# Patient Record
Sex: Female | Born: 1970 | Race: White | Hispanic: No | Marital: Married | Smoking: Never smoker
Health system: Southern US, Community
[De-identification: ages and names within clinical notes are randomized; demographics above are authoritative.]

## PROBLEM LIST (undated history)

## (undated) DIAGNOSIS — N6320 Unspecified lump in the left breast, unspecified quadrant: Secondary | ICD-10-CM

## (undated) DIAGNOSIS — Z923 Personal history of irradiation: Secondary | ICD-10-CM

## (undated) HISTORY — PX: BREAST LUMPECTOMY: SHX2

---

## 1988-12-01 HISTORY — PX: WISDOM TOOTH EXTRACTION: SHX21

## 2001-04-22 ENCOUNTER — Encounter: Admission: RE | Admit: 2001-04-22 | Discharge: 2001-04-22 | Payer: Self-pay | Admitting: Obstetrics and Gynecology

## 2001-04-22 ENCOUNTER — Encounter: Payer: Self-pay | Admitting: Obstetrics and Gynecology

## 2011-02-07 LAB — RPR: RPR: NONREACTIVE

## 2011-02-07 LAB — ABO/RH

## 2011-02-07 LAB — RUBELLA ANTIBODY, IGM: Rubella: NON-IMMUNE/NOT IMMUNE

## 2011-02-07 LAB — GC/CHLAMYDIA PROBE AMP, GENITAL: Chlamydia: NEGATIVE

## 2011-08-02 ENCOUNTER — Inpatient Hospital Stay (HOSPITAL_COMMUNITY): Admission: AD | Admit: 2011-08-02 | Payer: Self-pay | Source: Ambulatory Visit | Admitting: Obstetrics and Gynecology

## 2011-08-18 LAB — STREP B DNA PROBE: GBS: NEGATIVE

## 2011-09-08 ENCOUNTER — Other Ambulatory Visit: Payer: Self-pay | Admitting: Obstetrics and Gynecology

## 2011-09-08 ENCOUNTER — Encounter (HOSPITAL_COMMUNITY): Payer: Self-pay | Admitting: *Deleted

## 2011-09-08 ENCOUNTER — Inpatient Hospital Stay (HOSPITAL_COMMUNITY)
Admission: AD | Admit: 2011-09-08 | Discharge: 2011-09-11 | DRG: 765 | Disposition: A | Discharge status: Home / Self Care | Payer: Managed Care, Other (non HMO) | Source: Ambulatory Visit | Attending: Obstetrics and Gynecology | Admitting: Obstetrics and Gynecology

## 2011-09-08 ENCOUNTER — Encounter (HOSPITAL_COMMUNITY): Payer: Self-pay | Admitting: Anesthesiology

## 2011-09-08 ENCOUNTER — Encounter (HOSPITAL_COMMUNITY)
Admission: AD | Disposition: A | Discharge status: Home / Self Care | Payer: Self-pay | Source: Ambulatory Visit | Attending: Obstetrics and Gynecology

## 2011-09-08 ENCOUNTER — Inpatient Hospital Stay (HOSPITAL_COMMUNITY): Payer: Managed Care, Other (non HMO) | Admitting: Anesthesiology

## 2011-09-08 DIAGNOSIS — O324XX0 Maternal care for high head at term, not applicable or unspecified: Secondary | ICD-10-CM | POA: Diagnosis present

## 2011-09-08 DIAGNOSIS — O139 Gestational [pregnancy-induced] hypertension without significant proteinuria, unspecified trimester: Secondary | ICD-10-CM | POA: Diagnosis present

## 2011-09-08 DIAGNOSIS — O09519 Supervision of elderly primigravida, unspecified trimester: Secondary | ICD-10-CM | POA: Diagnosis present

## 2011-09-08 LAB — ABO/RH: RH Type: POSITIVE

## 2011-09-08 LAB — COMPREHENSIVE METABOLIC PANEL
ALT: 13 U/L (ref 0–35)
AST: 19 U/L (ref 0–37)
Albumin: 2.5 g/dL — ABNORMAL LOW (ref 3.5–5.2)
CO2: 20 mEq/L (ref 19–32)
Chloride: 102 mEq/L (ref 96–112)
GFR calc non Af Amer: 87 mL/min — ABNORMAL LOW (ref >90)
Potassium: 4.7 mEq/L (ref 3.5–5.1)
Sodium: 131 mEq/L — ABNORMAL LOW (ref 135–145)
Total Bilirubin: 0.3 mg/dL (ref 0.3–1.2)

## 2011-09-08 LAB — RPR: RPR: NONREACTIVE

## 2011-09-08 LAB — CBC
Hemoglobin: 14.2 g/dL (ref 12.0–15.0)
MCH: 32.8 pg (ref 26.0–34.0)
RBC: 4.33 MIL/uL (ref 3.87–5.11)
WBC: 8.9 10*3/uL (ref 4.0–10.5)

## 2011-09-08 LAB — RUBELLA ANTIBODY, IGM: Rubella: NON-IMMUNE/NOT IMMUNE

## 2011-09-08 LAB — STREP B DNA PROBE: GBS: NEGATIVE

## 2011-09-08 SURGERY — Surgical Case
Anesthesia: Epidural | Site: Abdomen | Wound class: Clean Contaminated

## 2011-09-08 MED ORDER — FENTANYL 2.5 MCG/ML BUPIVACAINE 1/10 % EPIDURAL INFUSION (WH - ANES)
INTRAMUSCULAR | Status: AC
  Filled 2011-09-08: qty 60

## 2011-09-08 MED ORDER — MEPERIDINE HCL 25 MG/ML IJ SOLN
INTRAMUSCULAR | Status: AC
  Filled 2011-09-08: qty 1

## 2011-09-08 MED ORDER — OXYTOCIN 20 UNITS IN LACTATED RINGERS INFUSION - SIMPLE
1.0000 m[IU]/min | INTRAVENOUS | Status: DC
  Administered 2011-09-08: 2 m[IU]/min via INTRAVENOUS
  Filled 2011-09-08: qty 1000

## 2011-09-08 MED ORDER — FENTANYL 2.5 MCG/ML BUPIVACAINE 1/10 % EPIDURAL INFUSION (WH - ANES)
INTRAMUSCULAR | Status: DC | PRN
  Administered 2011-09-08: 14 mL/h
  Administered 2011-09-08: 14 mL/h via EPIDURAL

## 2011-09-08 MED ORDER — EPHEDRINE 5 MG/ML INJ
INTRAVENOUS | Status: AC
  Filled 2011-09-08: qty 4

## 2011-09-08 MED ORDER — KETOROLAC TROMETHAMINE 60 MG/2ML IM SOLN
INTRAMUSCULAR | Status: AC
  Filled 2011-09-08: qty 2

## 2011-09-08 MED ORDER — CITRIC ACID-SODIUM CITRATE 334-500 MG/5ML PO SOLN
30.0000 mL | ORAL | Status: DC | PRN
  Administered 2011-09-08: 30 mL via ORAL
  Filled 2011-09-08: qty 15

## 2011-09-08 MED ORDER — SCOPOLAMINE 1 MG/3DAYS TD PT72
MEDICATED_PATCH | TRANSDERMAL | Status: AC
  Filled 2011-09-08: qty 1

## 2011-09-08 MED ORDER — ONDANSETRON HCL 4 MG/2ML IJ SOLN
INTRAMUSCULAR | Status: AC
  Filled 2011-09-08: qty 2

## 2011-09-08 MED ORDER — HYDROMORPHONE HCL 1 MG/ML IJ SOLN: 0.2500 mg | INTRAMUSCULAR | Status: DC | PRN

## 2011-09-08 MED ORDER — MORPHINE SULFATE 0.5 MG/ML IJ SOLN
INTRAMUSCULAR | Status: AC
  Filled 2011-09-08: qty 10

## 2011-09-08 MED ORDER — SODIUM BICARBONATE 8.4 % IV SOLN
INTRAVENOUS | Status: DC | PRN
  Administered 2011-09-08: 5 mL via EPIDURAL

## 2011-09-08 MED ORDER — OXYTOCIN 20 UNITS IN LACTATED RINGERS INFUSION - SIMPLE: 125.0000 mL/h | Freq: Once | INTRAVENOUS | Status: DC

## 2011-09-08 MED ORDER — FENTANYL CITRATE 0.05 MG/ML IJ SOLN
INTRAMUSCULAR | Status: AC
  Filled 2011-09-08: qty 2

## 2011-09-08 MED ORDER — DIPHENHYDRAMINE HCL 50 MG/ML IJ SOLN: 12.5000 mg | INTRAMUSCULAR | Status: DC | PRN

## 2011-09-08 MED ORDER — OXYTOCIN BOLUS FROM INFUSION
500.0000 mL | Freq: Once | INTRAVENOUS | Status: DC
  Filled 2011-09-08: qty 500

## 2011-09-08 MED ORDER — MEPERIDINE HCL 25 MG/ML IJ SOLN
INTRAMUSCULAR | Status: DC | PRN
  Administered 2011-09-08: 7 mg via INTRAVENOUS
  Administered 2011-09-08 (×3): 6 mg via INTRAVENOUS

## 2011-09-08 MED ORDER — OXYTOCIN 20 UNITS IN LACTATED RINGERS INFUSION - SIMPLE
INTRAVENOUS | Status: DC | PRN
  Administered 2011-09-08 (×2): 20 [IU] via INTRAVENOUS

## 2011-09-08 MED ORDER — MEPERIDINE HCL 25 MG/ML IJ SOLN
6.2500 mg | INTRAMUSCULAR | Status: DC | PRN
  Administered 2011-09-08: 12.5 mg via INTRAVENOUS

## 2011-09-08 MED ORDER — KETOROLAC TROMETHAMINE 30 MG/ML IJ SOLN: 15.0000 mg | Freq: Once | INTRAMUSCULAR | Status: AC | PRN

## 2011-09-08 MED ORDER — EPHEDRINE 5 MG/ML INJ
10.0000 mg | INTRAVENOUS | Status: DC | PRN
  Filled 2011-09-08: qty 4

## 2011-09-08 MED ORDER — PHENYLEPHRINE 40 MCG/ML (10ML) SYRINGE FOR IV PUSH (FOR BLOOD PRESSURE SUPPORT)
80.0000 ug | PREFILLED_SYRINGE | INTRAVENOUS | Status: DC | PRN
  Filled 2011-09-08: qty 5

## 2011-09-08 MED ORDER — SCOPOLAMINE 1 MG/3DAYS TD PT72
1.0000 | MEDICATED_PATCH | Freq: Once | TRANSDERMAL | Status: DC
  Administered 2011-09-09: 1.5 mg via TRANSDERMAL

## 2011-09-08 MED ORDER — OXYTOCIN 10 UNIT/ML IJ SOLN
INTRAMUSCULAR | Status: AC
  Filled 2011-09-08: qty 4

## 2011-09-08 MED ORDER — CEFAZOLIN SODIUM 1-5 GM-% IV SOLN
1.0000 g | Freq: Once | INTRAVENOUS | Status: AC
  Administered 2011-09-08: 1 g via INTRAVENOUS
  Filled 2011-09-08: qty 50

## 2011-09-08 MED ORDER — ONDANSETRON HCL 4 MG/2ML IJ SOLN
INTRAMUSCULAR | Status: DC | PRN
  Administered 2011-09-08: 4 mg via INTRAVENOUS

## 2011-09-08 MED ORDER — ONDANSETRON HCL 4 MG/2ML IJ SOLN: 4.0000 mg | Freq: Once | INTRAMUSCULAR | Status: AC | PRN

## 2011-09-08 MED ORDER — MORPHINE SULFATE (PF) 0.5 MG/ML IJ SOLN
INTRAMUSCULAR | Status: DC | PRN
Start: 2011-09-08 — End: 2011-09-08
  Administered 2011-09-08: 1 mg via INTRAVENOUS

## 2011-09-08 MED ORDER — LACTATED RINGERS IV SOLN: 500.0000 mL | Freq: Once | INTRAVENOUS | Status: DC

## 2011-09-08 MED ORDER — KETOROLAC TROMETHAMINE 60 MG/2ML IM SOLN
60.0000 mg | Freq: Once | INTRAMUSCULAR | Status: AC | PRN
  Administered 2011-09-09: 60 mg via INTRAMUSCULAR

## 2011-09-08 MED ORDER — LIDOCAINE HCL (PF) 1 % IJ SOLN
30.0000 mL | INTRAMUSCULAR | Status: DC | PRN
  Filled 2011-09-08 (×2): qty 30

## 2011-09-08 MED ORDER — PHENYLEPHRINE HCL 10 MG/ML IJ SOLN
INTRAMUSCULAR | Status: DC | PRN
  Administered 2011-09-08: 80 ug via INTRAVENOUS

## 2011-09-08 MED ORDER — ACETAMINOPHEN 325 MG PO TABS
650.0000 mg | ORAL_TABLET | ORAL | Status: DC | PRN
  Administered 2011-09-08: 650 mg via ORAL
  Filled 2011-09-08: qty 2

## 2011-09-08 MED ORDER — OXYCODONE-ACETAMINOPHEN 5-325 MG PO TABS: 2.0000 | ORAL_TABLET | ORAL | Status: DC | PRN

## 2011-09-08 MED ORDER — PHENYLEPHRINE 40 MCG/ML (10ML) SYRINGE FOR IV PUSH (FOR BLOOD PRESSURE SUPPORT)
PREFILLED_SYRINGE | INTRAVENOUS | Status: AC
  Filled 2011-09-08: qty 5

## 2011-09-08 MED ORDER — OXYTOCIN 20 UNITS IN LACTATED RINGERS INFUSION - SIMPLE
125.0000 mL/h | INTRAVENOUS | Status: AC
  Administered 2011-09-08: 125 mL/h via INTRAVENOUS

## 2011-09-08 MED ORDER — TERBUTALINE SULFATE 1 MG/ML IJ SOLN: 0.2500 mg | Freq: Once | INTRAMUSCULAR | Status: AC | PRN

## 2011-09-08 MED ORDER — ONDANSETRON HCL 4 MG/2ML IJ SOLN
4.0000 mg | Freq: Four times a day (QID) | INTRAMUSCULAR | Status: DC | PRN
  Administered 2011-09-08: 4 mg via INTRAVENOUS
  Filled 2011-09-08: qty 2

## 2011-09-08 MED ORDER — OXYTOCIN 20 UNITS IN LACTATED RINGERS INFUSION - SIMPLE
INTRAVENOUS | Status: AC
  Filled 2011-09-08: qty 1000

## 2011-09-08 MED ORDER — FENTANYL 2.5 MCG/ML BUPIVACAINE 1/10 % EPIDURAL INFUSION (WH - ANES)
14.0000 mL/h | INTRAMUSCULAR | Status: DC
  Filled 2011-09-08: qty 60

## 2011-09-08 MED ORDER — LACTATED RINGERS IV SOLN
INTRAVENOUS | Status: DC
  Administered 2011-09-08 (×4): via INTRAVENOUS

## 2011-09-08 MED ORDER — SODIUM BICARBONATE 8.4 % IV SOLN
INTRAVENOUS | Status: DC | PRN
  Administered 2011-09-08: 4 mL via EPIDURAL

## 2011-09-08 MED ORDER — MORPHINE SULFATE (PF) 0.5 MG/ML IJ SOLN
INTRAMUSCULAR | Status: DC | PRN
  Administered 2011-09-08: 4 mg via EPIDURAL

## 2011-09-08 MED ORDER — LIDOCAINE-EPINEPHRINE (PF) 2 %-1:200000 IJ SOLN
INTRAMUSCULAR | Status: AC
  Filled 2011-09-08: qty 20

## 2011-09-08 MED ORDER — FLEET ENEMA 7-19 GM/118ML RE ENEM: 1.0000 | ENEMA | RECTAL | Status: DC | PRN

## 2011-09-08 MED ORDER — LACTATED RINGERS IV SOLN
500.0000 mL | INTRAVENOUS | Status: DC | PRN
  Administered 2011-09-08: 300 mL via INTRAVENOUS

## 2011-09-08 MED ORDER — IBUPROFEN 600 MG PO TABS: 600.0000 mg | ORAL_TABLET | Freq: Four times a day (QID) | ORAL | Status: DC | PRN

## 2011-09-08 MED ORDER — FENTANYL CITRATE 0.05 MG/ML IJ SOLN
INTRAMUSCULAR | Status: DC | PRN
  Administered 2011-09-08 (×2): 50 ug via INTRAVENOUS

## 2011-09-08 MED ORDER — SODIUM BICARBONATE 8.4 % IV SOLN
INTRAVENOUS | Status: AC
  Filled 2011-09-08: qty 50

## 2011-09-08 SURGICAL SUPPLY — 31 items
CLOTH BEACON ORANGE TIMEOUT ST (SAFETY) ×2 IMPLANT
DRESSING TELFA 8X3 (GAUZE/BANDAGES/DRESSINGS) ×1 IMPLANT
ELECT REM PT RETURN 9FT ADLT (ELECTROSURGICAL) ×2
ELECTRODE REM PT RTRN 9FT ADLT (ELECTROSURGICAL) ×1 IMPLANT
EXTRACTOR VACUUM M CUP 4 TUBE (SUCTIONS) IMPLANT
GAUZE SPONGE 4X4 12PLY STRL LF (GAUZE/BANDAGES/DRESSINGS) ×4 IMPLANT
GLOVE BIO SURGEON STRL SZ7 (GLOVE) ×4 IMPLANT
GOWN PREVENTION PLUS LG XLONG (DISPOSABLE) ×4 IMPLANT
GOWN STRL REIN XL XLG (GOWN DISPOSABLE) ×2 IMPLANT
KIT ABG SYR 3ML LUER SLIP (SYRINGE) ×2 IMPLANT
NDL HYPO 25X5/8 SAFETYGLIDE (NEEDLE) ×1 IMPLANT
NEEDLE HYPO 25X5/8 SAFETYGLIDE (NEEDLE) ×2 IMPLANT
NS IRRIG 1000ML POUR BTL (IV SOLUTION) ×2 IMPLANT
PACK C SECTION WH (CUSTOM PROCEDURE TRAY) ×2 IMPLANT
PAD ABD 7.5X8 STRL (GAUZE/BANDAGES/DRESSINGS) ×1 IMPLANT
SLEEVE SCD COMPRESS KNEE MED (MISCELLANEOUS) IMPLANT
SPONGE GAUZE 4X4 12PLY (GAUZE/BANDAGES/DRESSINGS) ×1 IMPLANT
STAPLER VISISTAT 35W (STAPLE) ×1 IMPLANT
STRIP CLOSURE SKIN 1/4X4 (GAUZE/BANDAGES/DRESSINGS) ×1 IMPLANT
SUT CHROMIC 0 CT 1 (SUTURE) IMPLANT
SUT CHROMIC 0 CTX 36 (SUTURE) ×4 IMPLANT
SUT CHROMIC 2 0 SH (SUTURE) IMPLANT
SUT PDS AB 0 CT 36 (SUTURE) ×2 IMPLANT
SUT PLAIN 0 NONE (SUTURE) IMPLANT
SUT PLAIN 2 0 XLH (SUTURE) IMPLANT
SUT VIC AB 3-0 CT1 27 (SUTURE) ×2
SUT VIC AB 3-0 CT1 TAPERPNT 27 (SUTURE) ×1 IMPLANT
TAPE CLOTH SURG 4X10 WHT LF (GAUZE/BANDAGES/DRESSINGS) ×1 IMPLANT
TOWEL OR 17X24 6PK STRL BLUE (TOWEL DISPOSABLE) ×4 IMPLANT
TRAY FOLEY CATH 14FR (SET/KITS/TRAYS/PACK) ×1 IMPLANT
WATER STERILE IRR 1000ML POUR (IV SOLUTION) ×1 IMPLANT

## 2011-09-08 NOTE — Anesthesia Postprocedure Evaluation (Signed)
  Anesthesia Post-op Note  Patient: Traci Kemp  Procedure(s) Performed:  CESAREAN SECTION  Patient Location: PACU  Anesthesia Type: Epidural  Level of Consciousness: awake and patient cooperative  Airway and Oxygen Therapy: Patient Spontanous Breathing  Post-op Pain: none  Post-op Assessment: Post-op Vital signs reviewed  Post-op Vital Signs: Reviewed and stable  Complications: No apparent anesthesia complications

## 2011-09-08 NOTE — H&P (Signed)
Traci Kemp is a 40 y.o. female at 24 weeks admitted for induction due to [facility].  Followed with increasing bp"s over the last week.  BP today 144/100. cx adminable to arom.  Neg GBS Maternal Medical History:  Prenatal Complications - Diabetes: none.    OB History    Grav Para Term Preterm Abortions TAB SAB Ect Mult Living   1              History reviewed. No pertinent past medical history. Past Surgical History  Procedure Date  . Wisdom tooth extraction 1990   Family History: family history is not on file. Social History:  reports that she has never smoked. She has never used smokeless tobacco. She reports that she does not drink alcohol or use illicit drugs.  ROS  Dilation: 2 Effacement (%): 80 Station: -1 Exam by:: Dr Arelia Sneddon Blood pressure 155/95, pulse 74, temperature 98.2 F (36.8 C), temperature source Oral, resp. rate 20, height 5\' 1"  (1.549 m), weight 170 lb (77.111 kg), last menstrual period 12/09/2010. Maternal Exam:  Uterine Assessment: Contraction strength is mild.  Contraction frequency is irregular.   Abdomen: Patient reports no abdominal tenderness. Fundal height is c/w dates.   Estimated fetal weight is 7 lbs.   Fetal presentation: vertex  Introitus: Amniotic fluid character: clear.  Pelvis: adequate for delivery.   Cervix: Cervix evaluated by digital exam.     Physical Exam  Prenatal labs: ABO, Rh:   Antibody:   Rubella:   RPR:    HBsAg:    HIV:    GBS:     Assessment/Plan: IUP at 39 weeks.  Admitt for induction. Risk of pitocin discussed.   Traci Kemp S 09/08/2011, 1:07 PM

## 2011-09-08 NOTE — Transfer of Care (Signed)
Immediate Anesthesia Transfer of Care Note  Patient: Traci Kemp  Procedure(s) Performed:  CESAREAN SECTION  Patient Location: PACU  Anesthesia Type: Epidural  Level of Consciousness: awake  Airway & Oxygen Therapy: Patient Spontanous Breathing  Post-op Assessment: Report given to PACU RN  Post vital signs: Reviewed and stable  Complications: No apparent anesthesia complications

## 2011-09-08 NOTE — Op Note (Signed)
Preoperative diagnosis: Intrauterine pregnancy at term. Pregnancy-induced hypertension. Held vacuum extractor. Failure to descend.  Postoperative diagnosis: The same  Procedure: Low transverse cesarean section  Surgeon: Richardean Chimera  Anesthesia: Epidural  Estimated blood loss: 500 cc  Intraoperative blood replacement: None  Complications: None  Indications: This 40 year old primigravida female at 46 weeks. She's been followed in the office per the pregnancy-induced hypertension over the past 2 weeks today's blood pressure is 144/100. Cervix was amenable to adduction. She was brought in for induction of labor. He progressed to complete dilatation. He pushed for 2 and half hours. It'll vertex remained arrested at +2 station. The decision was to attempt a vacuum assisted vaginal delivery. Risk were discussed. The vacuum extractor there was no descent. The decision was to proceed with the primary cesarean section. The risk were discussed this includes the risk of infection. The risk of hemorrhage that could require transfusion with the risk of AIDS or hepatitis. Risk of injury to adjacent organs bleeding bladder bowel or ureters. Risk of deep venous thrombosis and pulmonary embolus.  Procedure: Patient segment oh are placed in supine position with left lateral tilt. After satisfactory level of epidural anesthesia was obtained the patient was prepped out Betadine and draped as a sterile field. A low transverse skin incision made with the knife and carried to subcutaneous tissue. The anterior rectus fascia was identified in her sharply. The incision the fascia was extended laterally. The rectus muscles were separated in the midline. The anterior peritoneum was entered sharply and the incision the peritoneum was extended both superiorly and inferiorly. A low transverse bladder flap was developed. A low transverse uterine incision was begun with the knife and extended laterally using manual traction. The  infant presented in the vertex presentation and was delivered with elevation head and fundal pressure. The infant was a viable female weight is pending. Apgars were 8/9. PH was 7.25. The placenta was delivered manually and sent to pathology. Uterus was exteriorized for closure. She had a subserosal fibroid posteriorly measuring approximately 3 cm. Tubes and ovaries were unremarkable. Uterus is) interlocking suture of 0 chromic using a 2 layer closure technique. We had good hemostasis and clear urine output. The uterus was returned to the abdominal cavity. We irrigated the pelvis. We had no active bleeding. Muscles and peritoneum) suture of 3-0 Vicryl. Fascia was closed with a running suture of 0 PDS. Skin was closed with staples and Steri-Strips. Sponge instrument and needle count prescribed by circulating nurse x2. Foley catheter was clear, closure. Patient was transferred recovery in good condition.

## 2011-09-08 NOTE — Anesthesia Preprocedure Evaluation (Addendum)
Anesthesia Evaluation  Name, MR# and DOB Patient awake  General Assessment Comment  Reviewed: Allergy & Precautions, H&P , Patient's Chart, lab work & pertinent test results  Airway Mallampati: II TM Distance: >3 FB Neck ROM: full    Dental  (+) Teeth Intact   Pulmonary  clear to auscultation        Cardiovascular hypertension (PIH, no meds), regular Normal    Neuro/Psych    GI/Hepatic   Endo/Other    Renal/GU      Musculoskeletal   Abdominal   Peds  Hematology   Anesthesia Other Findings       Reproductive/Obstetrics (+) Pregnancy                          Anesthesia Physical Anesthesia Plan  ASA: III  Anesthesia Plan: Epidural   Post-op Pain Management:    Induction:   Airway Management Planned:   Additional Equipment:   Intra-op Plan:   Post-operative Plan:   Informed Consent: I have reviewed the patients History and Physical, chart, labs and discussed the procedure including the risks, benefits and alternatives for the proposed anesthesia with the patient or authorized representative who has indicated his/her understanding and acceptance.   Dental Advisory Given  Plan Discussed with: CRNA and Surgeon  Anesthesia Plan Comments: (Labs checked- platelets confirmed with RN in room. Fetal heart tracing, per RN, reportedly stable enough for sitting procedure. Discussed epidural, and patient consents to the procedure:  included risk of possible headache,backache, failed block, allergic reaction, and nerve injury. This patient was asked if she had any questions or concerns before the procedure started. )       Anesthesia Quick Evaluation

## 2011-09-08 NOTE — Plan of Care (Signed)
Problem: Consults Goal: Birthing Suites Patient Information Press F2 to bring up selections list  Outcome: Completed/Met Date Met:  09/08/11  PIH (Pregnancy induced hypertension)

## 2011-09-08 NOTE — Brief Op Note (Signed)
09/08/2011  10:07 PM  PATIENT:  Traci Kemp  40 y.o. female  PRE-OPERATIVE DIAGNOSIS:  Arrest of Descent  POST-OPERATIVE DIAGNOSIS:  Arrest of Descent  PROCEDURE:  Procedure(s): CESAREAN SECTION  SURGEON:  Surgeon(s): Torey Reinard S Lakeyn Dokken  PHYSICIAN ASSISTANT:   ASSISTANTS: none   ANESTHESIA:   epidural  OR FLUID I/O:  Total I/O In: -  Out: 5 [Urine:5]  BLOOD ADMINISTERED:none  DRAINS: none   LOCAL MEDICATIONS USED:  NONE  SPECIMEN:  Source of Specimen:  placnta  DISPOSITION OF SPECIMEN:  PATHOLOGY  COUNTS:  YES  TOURNIQUET:  * No tourniquets in log *  DICTATION: .Dragon Dictation  PLAN OF CARE: Admit to inpatient   PATIENT DISPOSITION:  PACU - hemodynamically stable.   Delay start of Pharmacological VTE agent (>24hrs) due to surgical blood loss or risk of bleeding:  no

## 2011-09-08 NOTE — Progress Notes (Signed)
Pushing for 2.5 hours vtx at +2.  Discussed ve.. Risk explained including scalp hematoma and intracranial bleed. Pt preppedThe risk of cesarean section were discussed including.  The risk of infection.  The risk of  Hemorrhage that could lead to transfusion with risk of AIDs and Hepatitis.  Excessive bleeding could require hysterectomy.  Risk of injury to adjacent organs including bladder, ureters and bowel which could lead to further exploratory surgery. The risk of DVT and pulmonary embolus.  Patient expresses understanding of indications and risks.  and draped.  In dorsal lithotomy position ve applied with next ctx minimal descent with VE.  Precede with Cesarean section.

## 2011-09-08 NOTE — Anesthesia Procedure Notes (Signed)
Epidural Patient location during procedure: OB  Staffing Anesthesiologist: Jiles Garter  Preanesthetic Checklist Completed: patient identified, site marked, surgical consent, pre-op evaluation, timeout performed, IV checked, risks and benefits discussed and monitors and equipment checked  Epidural Patient position: sitting Prep: site prepped and draped and DuraPrep Patient monitoring: continuous pulse ox and blood pressure Approach: midline Injection technique: LOR air  Needle:  Needle type: Tuohy  Needle gauge: 17 G Needle length: 9 cm Needle insertion depth: 6 cm Catheter type: closed end flexible Catheter size: 19 Gauge Catheter at skin depth: 12 cm Test dose: negative  Assessment Events: blood not aspirated, injection not painful, no injection resistance, negative IV test and no paresthesia  Additional Notes Dosing of Epidural: 1st dose, through needle...... ( mg expressed as equavilent  cc's medication  from .1%Bupiv / fentanyl syringe from L&D pump)...............  5mg  Marcaine  2nd dose, through catheter after waiting 3 minutes.... epi 1:200K + Xylocaine 40 mg 3rd dose, through catheter, after waiting 3 minutes...Marland KitchenMarland Kitchenepi 1:200K + Xylocaine 40 mg ( 2% Xylo charted as a single dose in Epic Meds for ease of charting; actual dosing was fractionated as above, for saftey's sake)  As each dose occurred, patient was free of IV sx; and patient exhibited no evidence of SA injection.  Patient is more comfortable after epidural dosed. Please see RN's note for documentation of vital signs,and FHR which are stable.

## 2011-09-09 ENCOUNTER — Encounter (HOSPITAL_COMMUNITY): Payer: Self-pay | Admitting: Obstetrics and Gynecology

## 2011-09-09 LAB — CBC
Hemoglobin: 10.9 g/dL — ABNORMAL LOW (ref 12.0–15.0)
MCH: 32.5 pg (ref 26.0–34.0)
MCHC: 34.2 g/dL (ref 30.0–36.0)
MCV: 94.4 fL (ref 78.0–100.0)
Platelets: 136 10*3/uL — ABNORMAL LOW (ref 150–400)
RDW: 13.9 % (ref 11.5–15.5)
WBC: 15.2 10*3/uL — ABNORMAL HIGH (ref 4.0–10.5)

## 2011-09-09 LAB — COMPREHENSIVE METABOLIC PANEL
AST: 38 U/L — ABNORMAL HIGH (ref 0–37)
Albumin: 2.1 g/dL — ABNORMAL LOW (ref 3.5–5.2)
Calcium: 8.3 mg/dL — ABNORMAL LOW (ref 8.4–10.5)
Chloride: 100 mEq/L (ref 96–112)
Creatinine, Ser: 1.1 mg/dL (ref 0.50–1.10)
Total Bilirubin: 0.3 mg/dL (ref 0.3–1.2)
Total Protein: 4.9 g/dL — ABNORMAL LOW (ref 6.0–8.3)

## 2011-09-09 LAB — RPR: RPR Ser Ql: NONREACTIVE

## 2011-09-09 MED ORDER — SODIUM CHLORIDE 0.9 % IJ SOLN: 3.0000 mL | INTRAMUSCULAR | Status: DC | PRN

## 2011-09-09 MED ORDER — NALOXONE HCL 0.4 MG/ML IJ SOLN: 0.4000 mg | INTRAMUSCULAR | Status: DC | PRN

## 2011-09-09 MED ORDER — DIPHENHYDRAMINE HCL 50 MG/ML IJ SOLN: 25.0000 mg | INTRAMUSCULAR | Status: DC | PRN

## 2011-09-09 MED ORDER — WITCH HAZEL-GLYCERIN EX PADS: 1.0000 "application " | MEDICATED_PAD | CUTANEOUS | Status: DC | PRN

## 2011-09-09 MED ORDER — MENTHOL 3 MG MT LOZG: 1.0000 | LOZENGE | OROMUCOSAL | Status: DC | PRN

## 2011-09-09 MED ORDER — KETOROLAC TROMETHAMINE 30 MG/ML IJ SOLN: 30.0000 mg | Freq: Four times a day (QID) | INTRAMUSCULAR | Status: AC | PRN

## 2011-09-09 MED ORDER — MEPERIDINE HCL 25 MG/ML IJ SOLN: 6.2500 mg | INTRAMUSCULAR | Status: DC | PRN

## 2011-09-09 MED ORDER — NALBUPHINE HCL 10 MG/ML IJ SOLN
5.0000 mg | INTRAMUSCULAR | Status: DC | PRN
  Filled 2011-09-09: qty 1

## 2011-09-09 MED ORDER — SODIUM CHLORIDE 0.9 % IJ SOLN: 3.0000 mL | Freq: Two times a day (BID) | INTRAMUSCULAR | Status: DC

## 2011-09-09 MED ORDER — IBUPROFEN 600 MG PO TABS
600.0000 mg | ORAL_TABLET | Freq: Four times a day (QID) | ORAL | Status: DC
  Administered 2011-09-09 – 2011-09-11 (×9): 600 mg via ORAL
  Filled 2011-09-09 (×9): qty 1

## 2011-09-09 MED ORDER — SODIUM CHLORIDE 0.9 % IV SOLN: 250.0000 mL | INTRAVENOUS | Status: DC

## 2011-09-09 MED ORDER — DIPHENHYDRAMINE HCL 25 MG PO CAPS: 25.0000 mg | ORAL_CAPSULE | Freq: Four times a day (QID) | ORAL | Status: DC | PRN

## 2011-09-09 MED ORDER — ONDANSETRON HCL 4 MG/2ML IJ SOLN: 4.0000 mg | Freq: Three times a day (TID) | INTRAMUSCULAR | Status: DC | PRN

## 2011-09-09 MED ORDER — METOCLOPRAMIDE HCL 5 MG/ML IJ SOLN: 10.0000 mg | Freq: Three times a day (TID) | INTRAMUSCULAR | Status: DC | PRN

## 2011-09-09 MED ORDER — SODIUM CHLORIDE 0.9 % IV SOLN
1.0000 ug/kg/h | INTRAVENOUS | Status: DC | PRN
  Filled 2011-09-09: qty 2.5

## 2011-09-09 MED ORDER — PRENATAL PLUS 27-1 MG PO TABS: 1.0000 | ORAL_TABLET | Freq: Every day | ORAL | Status: DC

## 2011-09-09 MED ORDER — KETOROLAC TROMETHAMINE 30 MG/ML IJ SOLN
30.0000 mg | Freq: Four times a day (QID) | INTRAMUSCULAR | Status: AC | PRN
  Administered 2011-09-09: 30 mg via INTRAVENOUS
  Filled 2011-09-09: qty 1

## 2011-09-09 MED ORDER — SIMETHICONE 80 MG PO CHEW: 80.0000 mg | CHEWABLE_TABLET | ORAL | Status: DC | PRN

## 2011-09-09 MED ORDER — DIBUCAINE 1 % RE OINT: 1.0000 "application " | TOPICAL_OINTMENT | RECTAL | Status: DC | PRN

## 2011-09-09 MED ORDER — SENNOSIDES-DOCUSATE SODIUM 8.6-50 MG PO TABS
2.0000 | ORAL_TABLET | Freq: Every day | ORAL | Status: DC
  Administered 2011-09-09: 2 via ORAL

## 2011-09-09 MED ORDER — ZOLPIDEM TARTRATE 5 MG PO TABS: 5.0000 mg | ORAL_TABLET | Freq: Every evening | ORAL | Status: DC | PRN

## 2011-09-09 MED ORDER — PRENATAL PLUS 27-1 MG PO TABS
1.0000 | ORAL_TABLET | Freq: Every day | ORAL | Status: DC
  Administered 2011-09-10 – 2011-09-11 (×2): 1 via ORAL
  Filled 2011-09-09 (×2): qty 1

## 2011-09-09 MED ORDER — TETANUS-DIPHTH-ACELL PERTUSSIS 5-2.5-18.5 LF-MCG/0.5 IM SUSP: 0.5000 mL | Freq: Once | INTRAMUSCULAR | Status: DC

## 2011-09-09 MED ORDER — LACTATED RINGERS IV SOLN
INTRAVENOUS | Status: DC
  Administered 2011-09-09: 06:00:00 via INTRAVENOUS

## 2011-09-09 MED ORDER — DIPHENHYDRAMINE HCL 25 MG PO CAPS: 25.0000 mg | ORAL_CAPSULE | ORAL | Status: DC | PRN

## 2011-09-09 MED ORDER — SCOPOLAMINE 1 MG/3DAYS TD PT72: 1.0000 | MEDICATED_PATCH | TRANSDERMAL | Status: DC

## 2011-09-09 MED ORDER — BISACODYL 10 MG RE SUPP
10.0000 mg | Freq: Every day | RECTAL | Status: DC | PRN
Start: 2011-09-09 — End: 2011-09-11

## 2011-09-09 MED ORDER — OXYCODONE-ACETAMINOPHEN 5-325 MG PO TABS: 1.0000 | ORAL_TABLET | ORAL | Status: DC | PRN

## 2011-09-09 MED ORDER — FLEET ENEMA 7-19 GM/118ML RE ENEM: 1.0000 | ENEMA | RECTAL | Status: DC | PRN

## 2011-09-09 MED ORDER — ONDANSETRON HCL 4 MG/2ML IJ SOLN: 4.0000 mg | INTRAMUSCULAR | Status: DC | PRN

## 2011-09-09 MED ORDER — DIPHENHYDRAMINE HCL 50 MG/ML IJ SOLN: 12.5000 mg | INTRAMUSCULAR | Status: DC | PRN

## 2011-09-09 MED ORDER — IBUPROFEN 600 MG PO TABS: 600.0000 mg | ORAL_TABLET | Freq: Four times a day (QID) | ORAL | Status: DC | PRN

## 2011-09-09 MED ORDER — ONDANSETRON HCL 4 MG PO TABS: 4.0000 mg | ORAL_TABLET | ORAL | Status: DC | PRN

## 2011-09-09 MED ORDER — SIMETHICONE 80 MG PO CHEW
80.0000 mg | CHEWABLE_TABLET | Freq: Three times a day (TID) | ORAL | Status: DC
  Administered 2011-09-09 – 2011-09-11 (×8): 80 mg via ORAL

## 2011-09-09 MED ORDER — LANOLIN HYDROUS EX OINT: 1.0000 "application " | TOPICAL_OINTMENT | CUTANEOUS | Status: DC | PRN

## 2011-09-09 NOTE — Anesthesia Postprocedure Evaluation (Signed)
  Anesthesia Post-op Note  Patient: Traci Kemp  Procedure(s) Performed:  CESAREAN SECTION  Patient Location: PACU and Mother/Baby  Anesthesia Type: Epidural  Level of Consciousness: awake, alert , oriented and patient cooperative  Airway and Oxygen Therapy: Patient Spontanous Breathing  Post-op Pain: mild  Post-op Assessment: Post-op Vital signs reviewed and Patient's Cardiovascular Status Stable  Post-op Vital Signs: Reviewed and stable  Complications: No apparent anesthesia complications

## 2011-09-09 NOTE — Progress Notes (Signed)
Encounter addended by: Marrion Coy on: 09/09/2011  8:54 AM<BR>     Documentation filed: Notes Section

## 2011-09-09 NOTE — Progress Notes (Signed)
Pt. Vs are 120/76 76 heart rate regular temp 97.8 resp. At 18 lungs clear bil.  Pt. States she feels better eyes are less dilated reactive to light bil.  Vision less blurry pt. Resting quietly in bed will cont. To assess labs done night shift rn will call md with lab results

## 2011-09-09 NOTE — Progress Notes (Signed)
Subjective: Postpartum Day 1: Cesarean Delivery Patient reports tolerating PO.  Denies HA, RUQ pain  Objective: Vital signs in last 24 hours: Temp:  [98.1 F (36.7 C)-100.1 F (37.8 C)] 98.7 F (37.1 C) (10/09 0557) Pulse Rate:  [69-247] 79  (10/09 0557) Resp:  [16-22] 18  (10/09 0557) BP: (90-192)/(60-145) 150/88 mmHg (10/09 0557) SpO2:  [88 %-100 %] 97 % (10/09 0700) Weight:  [77.111 kg (170 lb)] 170 lb (77.111 kg) (10/08 1222)  Physical Exam:  General: alert, cooperative and no distress Lochia: appropriate Uterine Fundus: firm Bandage CDI DVT Evaluation: No evidence of DVT seen on physical exam. DTR's 2-3 + no clonus. Small pedal edema   Basename 09/09/11 0515 09/08/11 1220  HGB 10.9* 14.2  HCT 31.9* 40.8    Assessment/Plan: Status post Cesarean section. Postoperative course complicated by [facility]  Continue current care PIH labs in am.  Ayala Ribble G 09/09/2011, 8:01 AM

## 2011-09-09 NOTE — Anesthesia Postprocedure Evaluation (Signed)
Anesthesia Post Note  Patient: Traci Kemp  Procedure(s) Performed:  CESAREAN SECTION  Anesthesia type:Epidural  Patient location: PACU  Post pain: Pain level controlled  Post assessment: Post-op Vital signs reviewed  Last Vitals:  Filed Vitals:   09/08/11 2330  BP: 117/83  Pulse: 99  Temp:   Resp: 18    Post vital signs: Reviewed  Level of consciousness: awake  Complications: No apparent anesthesia complications

## 2011-09-09 NOTE — Progress Notes (Signed)
Pt. Husband called rn into room  Stated wife seems confused to him pt. Standing in room eyes mod. Dilated will react to light pt. States eyes are blurry sees 4 eyes in husband and rn vs are 141/90 ,73 heart sounds steady and regular resp. At 20 lungs clear bil. Temp at 98.1 pt. Was looking for dog stated picture on wall had cartoon people in it. Pt. States has not slept since am of 09/08/11 husband states the same pt. States she touched scopolamine patch behind ear and may have touched eye pt. Alert seems somewhat unsure of how to describe what she feels but is orient to person place and time  md notifed  See orders

## 2011-09-10 LAB — COMPREHENSIVE METABOLIC PANEL
Albumin: 2.1 g/dL — ABNORMAL LOW (ref 3.5–5.2)
Alkaline Phosphatase: 79 U/L (ref 39–117)
BUN: 17 mg/dL (ref 6–23)
CO2: 25 mEq/L (ref 19–32)
Calcium: 8.5 mg/dL (ref 8.4–10.5)
Chloride: 102 mEq/L (ref 96–112)
Creatinine, Ser: 1.04 mg/dL (ref 0.50–1.10)
GFR calc non Af Amer: 65 mL/min — ABNORMAL LOW (ref >90)
Glucose, Bld: 69 mg/dL — ABNORMAL LOW (ref 70–99)
Total Bilirubin: 0.2 mg/dL — ABNORMAL LOW (ref 0.3–1.2)

## 2011-09-10 LAB — CBC
HCT: 27.4 % — ABNORMAL LOW (ref 36.0–46.0)
Hemoglobin: 9.3 g/dL — ABNORMAL LOW (ref 12.0–15.0)
MCH: 32.4 pg (ref 26.0–34.0)
MCV: 95.5 fL (ref 78.0–100.0)
Platelets: 140 10*3/uL — ABNORMAL LOW (ref 150–400)
RBC: 2.87 MIL/uL — ABNORMAL LOW (ref 3.87–5.11)
RDW: 14.1 % (ref 11.5–15.5)
WBC: 12.9 10*3/uL — ABNORMAL HIGH (ref 4.0–10.5)

## 2011-09-10 LAB — URIC ACID: Uric Acid, Serum: 7.5 mg/dL — ABNORMAL HIGH (ref 2.4–7.0)

## 2011-09-10 NOTE — Progress Notes (Signed)
Subjective: Postpartum Day 2: Cesarean Delivery Patient reports tolerating PO and no problems voiding. Denies Ha , blurred vision. No further confusion. Does report some sensory deprivation in LLE. "knees buckled upon standing yesterday"   Objective: Vital signs in last 24 hours: Temp:  [97.8 F (36.6 C)-98.7 F (37.1 C)] 98.2 F (36.8 C) (10/10 0606) Pulse Rate:  [63-77] 66  (10/10 0606) Resp:  [18-20] 20  (10/10 0606) BP: (113-147)/(64-90) 119/64 mmHg (10/10 0606) SpO2:  [96 %-99 %] 96 % (10/09 2245)  Physical Exam:  General: alert, cooperative and no distress Lochia: appropriate Uterine Fundus: firm BAndage CDI DVT Evaluation: No evidence of DVT seen on physical exam. DTR's 2-3 + 1+ pitting edema bilaterally noted to knees   Basename 09/10/11 0525 09/09/11 2240  HGB 9.3* 9.4*  HCT 27.4* 26.8*    Assessment/Plan: Status post Cesarean section. Postoperative course complicated by confusion and slight elevation in BP with normal LFT's and platelets  Continue current care. If paresthesia persist, anesthesia consult  Zaydn Gutridge G 09/10/2011, 8:09 AM

## 2011-09-10 NOTE — Progress Notes (Signed)
Called earlier re:pt had bilat dilated pupils (scopolamine patch on), thought her dog was in the room for a moment. Now states she is tired but doing OK.  Has some blurry vision with dilated pupils.  Thinks she may have touched her scope patch then rubbed her eyes. No HA, no epigastric pain. VSS Afeb, Bps OK Pt oriented x 3, smiling, appropriate Lungs CTA Cor RRR Abd soft, no epigastric tenderness DTR 2+ without clonus  Labs AST slightly up and creat + 1.10  A/P: Scope patch now off, I believe pupil dilation is secondary to that. BPs OK Will repeat labs later this am  Results for orders placed during the hospital encounter of 09/08/11 (from the past 24 hour(s))  CBC     Status: Abnormal   Collection Time   09/09/11  5:15 AM      Component Value Range   WBC 20.9 (*) 4.0 - 10.5 (K/uL)   RBC 3.35 (*) 3.87 - 5.11 (MIL/uL)   Hemoglobin 10.9 (*) 12.0 - 15.0 (g/dL)   HCT 16.1 (*) 09.6 - 46.0 (%)   MCV 95.2  78.0 - 100.0 (fL)   MCH 32.5  26.0 - 34.0 (pg)   MCHC 34.2  30.0 - 36.0 (g/dL)   RDW 04.5  40.9 - 81.1 (%)   Platelets 158  150 - 400 (K/uL)  CBC     Status: Abnormal   Collection Time   09/09/11 10:40 PM      Component Value Range   WBC 15.2 (*) 4.0 - 10.5 (K/uL)   RBC 2.84 (*) 3.87 - 5.11 (MIL/uL)   Hemoglobin 9.4 (*) 12.0 - 15.0 (g/dL)   HCT 91.4 (*) 78.2 - 46.0 (%)   MCV 94.4  78.0 - 100.0 (fL)   MCH 33.1  26.0 - 34.0 (pg)   MCHC 35.1  30.0 - 36.0 (g/dL)   RDW 95.6  21.3 - 08.6 (%)   Platelets 136 (*) 150 - 400 (K/uL)  COMPREHENSIVE METABOLIC PANEL     Status: Abnormal   Collection Time   09/09/11 10:40 PM      Component Value Range   Sodium 129 (*) 135 - 145 (mEq/L)   Potassium 4.1  3.5 - 5.1 (mEq/L)   Chloride 100  96 - 112 (mEq/L)   CO2 24  19 - 32 (mEq/L)   Glucose, Bld 102 (*) 70 - 99 (mg/dL)   BUN 17  6 - 23 (mg/dL)   Creatinine, Ser 5.78  0.50 - 1.10 (mg/dL)   Calcium 8.3 (*) 8.4 - 10.5 (mg/dL)   Total Protein 4.9 (*) 6.0 - 8.3 (g/dL)   Albumin 2.1 (*) 3.5 -  5.2 (g/dL)   AST 38 (*) 0 - 37 (U/L)   ALT 16  0 - 35 (U/L)   Alkaline Phosphatase 75  39 - 117 (U/L)   Total Bilirubin 0.3  0.3 - 1.2 (mg/dL)   GFR calc non Af Amer 61 (*) >90 (mL/min)   GFR calc Af Amer 71 (*) >90 (mL/min)  URIC ACID     Status: Abnormal   Collection Time   09/09/11 10:40 PM      Component Value Range   Uric Acid, Serum 7.3 (*) 2.4 - 7.0 (mg/dL)

## 2011-09-10 NOTE — Consults (Signed)
Asked to see Mrs. Traci Kemp for complaint of left leg unsteadiness with ambulation.  She had a C/S for FTP on 09/08/11 after 2.5 hrs of pushing and attempted vacuum delivery.  She had epidural anesthesia for her labor and surgical delivery.  She reports that after she began to ambulate following surgery she has noticed that her left leg seems unsteady.  She has noticed no sensory deficits or paresthesias.  The condition has not gotten worse.  The right leg seems to be fine.  She denies bowel/bladder dysfunction.  She is otherwise feeling well.  On exam she has 5/5 motor strength in both legs and arms and normal sensation throughout.  On standing she has a normal appearing gait, but does use handholds for support.  She reports a subjective "unsteadiness" in left leg with a "tendancy to buckle."  I observed her walking from her room to the ice machine unassisted without difficulty.  This is most likely femoral nerve compression from labor and pushing.  I reassured the patient and her husband that this condition will likely resolve over the next couple of weeks on its own.  I suggested PT/OT consult for cane or walker, but patient says she has canes and walkers at home for her mother and does not need this consult.  I encouraged her to use assistance or handholds when walking.  Jasmine December, MD

## 2011-09-11 LAB — COMPREHENSIVE METABOLIC PANEL
BUN: 11 mg/dL (ref 6–23)
Calcium: 8.8 mg/dL (ref 8.4–10.5)
Creatinine, Ser: 0.84 mg/dL (ref 0.50–1.10)
GFR calc Af Amer: >90 mL/min (ref >90)
Glucose, Bld: 118 mg/dL — ABNORMAL HIGH (ref 70–99)
Total Protein: 5 g/dL — ABNORMAL LOW (ref 6.0–8.3)

## 2011-09-11 LAB — CBC
HCT: 27.2 % — ABNORMAL LOW (ref 36.0–46.0)
Hemoglobin: 9.2 g/dL — ABNORMAL LOW (ref 12.0–15.0)
MCH: 32.7 pg (ref 26.0–34.0)
MCHC: 33.8 g/dL (ref 30.0–36.0)

## 2011-09-11 MED ORDER — MEASLES, MUMPS & RUBELLA VAC ~~LOC~~ INJ
0.5000 mL | INJECTION | Freq: Once | SUBCUTANEOUS | Status: AC
  Administered 2011-09-11: 0.5 mL via SUBCUTANEOUS
  Filled 2011-09-11: qty 0.5

## 2011-09-11 MED ORDER — IBUPROFEN 600 MG PO TABS: 600.0000 mg | ORAL_TABLET | Freq: Four times a day (QID) | ORAL | Status: AC

## 2011-09-11 MED ORDER — OXYCODONE-ACETAMINOPHEN 5-325 MG PO TABS: 1.0000 | ORAL_TABLET | ORAL | Status: AC | PRN

## 2011-09-11 NOTE — Discharge Summary (Signed)
Obstetric Discharge Summary Reason for Admission: induction of labor Prenatal Procedures: none Intrapartum Procedures: cesarean: low cervical, transverse Postpartum Procedures: none Complications-Operative and Postpartum: pih Hemoglobin  Date Value Range Status  09/10/2011 9.3* 12.0-15.0 (g/dL) Final     HCT  Date Value Range Status  09/10/2011 27.4* 36.0-46.0 (%) Final    Discharge Diagnoses: Term Pregnancy-delivered  Discharge Information: Date: 09/11/2011 Activity: pelvic rest Diet: routine Medications: PNV, Ibuprofen and Percocet Condition: stable Instructions: refer to practice specific booklet Discharge to: home   Newborn Data: Live born female  Birth Weight: 6 lb 6.8 oz (2915 g) APGAR: 8, 9  Home with mother.  Tkai Large G 09/11/2011, 8:30 AM

## 2011-09-11 NOTE — Progress Notes (Signed)
Subjective: Postpartum Day 3: Cesarean Delivery Patient reports tolerating PO, + flatus and no problems voiding. L leg continues to feel somewhat unsteady. Denies HA, blurred vision or RUQ pain   Objective: Vital signs in last 24 hours: Temp:  [98.2 F (36.8 C)-98.6 F (37 C)] 98.5 F (36.9 C) (10/11 0651) Pulse Rate:  [70-87] 73  (10/11 0651) Resp:  [20] 20  (10/11 0651) BP: (145-146)/(84-94) 145/89 mmHg (10/11 0651) SpO2:  [96 %] 96 % (10/11 0651)  Physical Exam:  General: alert, cooperative and no distress Lochia: appropriate Uterine Fundus: firm Incision: healing well, staples removed DVT Evaluation: No evidence of DVT seen on physical exam.   Basename 09/10/11 0525 09/09/11 2240  HGB 9.3* 9.4*  HCT 27.4* 26.8*    Assessment/Plan: Status post Cesarean section. Doing well postoperatively.  Discharge home with standard precautions and return to clinic in 2-3 days for bp check.  Terressa Evola G 09/11/2011, 8:03 AM

## 2011-09-16 ENCOUNTER — Inpatient Hospital Stay (HOSPITAL_COMMUNITY): Admission: RE | Admit: 2011-09-16 | Payer: Self-pay | Source: Ambulatory Visit

## 2014-10-02 ENCOUNTER — Encounter (HOSPITAL_COMMUNITY): Payer: Self-pay | Admitting: Obstetrics and Gynecology

## 2017-08-31 HISTORY — PX: BREAST BIOPSY: SHX20

## 2017-09-11 ENCOUNTER — Other Ambulatory Visit: Payer: Self-pay | Admitting: Obstetrics and Gynecology

## 2017-09-11 DIAGNOSIS — R928 Other abnormal and inconclusive findings on diagnostic imaging of breast: Secondary | ICD-10-CM

## 2017-09-16 ENCOUNTER — Ambulatory Visit
Admission: RE | Admit: 2017-09-16 | Discharge: 2017-09-16 | Disposition: A | Discharge status: Home / Self Care | Payer: Managed Care, Other (non HMO) | Source: Ambulatory Visit | Attending: Obstetrics and Gynecology | Admitting: Obstetrics and Gynecology

## 2017-09-16 ENCOUNTER — Other Ambulatory Visit: Payer: Self-pay | Admitting: Obstetrics and Gynecology

## 2017-09-16 DIAGNOSIS — R928 Other abnormal and inconclusive findings on diagnostic imaging of breast: Secondary | ICD-10-CM

## 2017-09-16 DIAGNOSIS — N6489 Other specified disorders of breast: Secondary | ICD-10-CM

## 2017-09-18 ENCOUNTER — Other Ambulatory Visit: Payer: Self-pay | Admitting: Obstetrics and Gynecology

## 2017-09-18 ENCOUNTER — Ambulatory Visit
Admission: RE | Admit: 2017-09-18 | Discharge: 2017-09-18 | Disposition: A | Discharge status: Home / Self Care | Payer: Managed Care, Other (non HMO) | Source: Ambulatory Visit | Attending: Obstetrics and Gynecology | Admitting: Obstetrics and Gynecology

## 2017-09-18 DIAGNOSIS — N6489 Other specified disorders of breast: Secondary | ICD-10-CM

## 2017-10-01 ENCOUNTER — Other Ambulatory Visit: Payer: Self-pay | Admitting: General Surgery

## 2017-10-01 DIAGNOSIS — N6489 Other specified disorders of breast: Secondary | ICD-10-CM

## 2017-10-30 ENCOUNTER — Encounter (HOSPITAL_BASED_OUTPATIENT_CLINIC_OR_DEPARTMENT_OTHER): Payer: Self-pay | Admitting: *Deleted

## 2017-10-30 ENCOUNTER — Other Ambulatory Visit: Payer: Self-pay

## 2017-10-31 HISTORY — PX: BREAST LUMPECTOMY: SHX2

## 2017-11-04 ENCOUNTER — Ambulatory Visit
Admission: RE | Admit: 2017-11-04 | Discharge: 2017-11-04 | Disposition: A | Discharge status: Home / Self Care | Payer: Managed Care, Other (non HMO) | Source: Ambulatory Visit | Attending: General Surgery | Admitting: General Surgery

## 2017-11-04 DIAGNOSIS — N6489 Other specified disorders of breast: Secondary | ICD-10-CM

## 2017-11-04 NOTE — Progress Notes (Signed)
Ensure pre surgery drink given with instructions to complete by 0700 dos, pt verbalized understanding. 

## 2017-11-05 ENCOUNTER — Encounter (HOSPITAL_BASED_OUTPATIENT_CLINIC_OR_DEPARTMENT_OTHER)
Admission: RE | Disposition: A | Discharge status: Home / Self Care | Payer: Self-pay | Source: Ambulatory Visit | Attending: General Surgery

## 2017-11-05 ENCOUNTER — Ambulatory Visit (HOSPITAL_BASED_OUTPATIENT_CLINIC_OR_DEPARTMENT_OTHER): Payer: Managed Care, Other (non HMO) | Admitting: Anesthesiology

## 2017-11-05 ENCOUNTER — Other Ambulatory Visit: Payer: Self-pay

## 2017-11-05 ENCOUNTER — Encounter (HOSPITAL_BASED_OUTPATIENT_CLINIC_OR_DEPARTMENT_OTHER): Payer: Self-pay | Admitting: Emergency Medicine

## 2017-11-05 ENCOUNTER — Ambulatory Visit (HOSPITAL_BASED_OUTPATIENT_CLINIC_OR_DEPARTMENT_OTHER)
Admission: RE | Admit: 2017-11-05 | Discharge: 2017-11-05 | Disposition: A | Discharge status: Home / Self Care | Payer: Managed Care, Other (non HMO) | Source: Ambulatory Visit | Attending: General Surgery | Admitting: General Surgery

## 2017-11-05 ENCOUNTER — Ambulatory Visit
Admission: RE | Admit: 2017-11-05 | Discharge: 2017-11-05 | Disposition: A | Discharge status: Home / Self Care | Payer: Managed Care, Other (non HMO) | Source: Ambulatory Visit | Attending: General Surgery | Admitting: General Surgery

## 2017-11-05 DIAGNOSIS — D0592 Unspecified type of carcinoma in situ of left breast: Secondary | ICD-10-CM | POA: Insufficient documentation

## 2017-11-05 DIAGNOSIS — N6489 Other specified disorders of breast: Secondary | ICD-10-CM

## 2017-11-05 HISTORY — DX: Unspecified lump in the left breast, unspecified quadrant: N63.20

## 2017-11-05 HISTORY — PX: RADIOACTIVE SEED GUIDED EXCISIONAL BREAST BIOPSY: SHX6490

## 2017-11-05 SURGERY — RADIOACTIVE SEED GUIDED BREAST BIOPSY
Anesthesia: General | Site: Breast | Laterality: Left

## 2017-11-05 MED ORDER — ONDANSETRON HCL 4 MG/2ML IJ SOLN
INTRAMUSCULAR | Status: DC | PRN
  Administered 2017-11-05: 4 mg via INTRAVENOUS

## 2017-11-05 MED ORDER — LIDOCAINE 2% (20 MG/ML) 5 ML SYRINGE
INTRAMUSCULAR | Status: AC
  Filled 2017-11-05: qty 5

## 2017-11-05 MED ORDER — LIDOCAINE HCL (CARDIAC) 20 MG/ML IV SOLN
INTRAVENOUS | Status: DC | PRN
  Administered 2017-11-05: 80 mg via INTRAVENOUS

## 2017-11-05 MED ORDER — ACETAMINOPHEN 500 MG PO TABS
1000.0000 mg | ORAL_TABLET | ORAL | Status: AC
  Administered 2017-11-05: 1000 mg via ORAL

## 2017-11-05 MED ORDER — MIDAZOLAM HCL 5 MG/5ML IJ SOLN
INTRAMUSCULAR | Status: DC | PRN
  Administered 2017-11-05: 2 mg via INTRAVENOUS

## 2017-11-05 MED ORDER — MIDAZOLAM HCL 2 MG/2ML IJ SOLN: 1.0000 mg | INTRAMUSCULAR | Status: DC | PRN

## 2017-11-05 MED ORDER — PROMETHAZINE HCL 25 MG/ML IJ SOLN: 6.2500 mg | INTRAMUSCULAR | Status: DC | PRN

## 2017-11-05 MED ORDER — FENTANYL CITRATE (PF) 100 MCG/2ML IJ SOLN
INTRAMUSCULAR | Status: AC
  Filled 2017-11-05: qty 2

## 2017-11-05 MED ORDER — PROPOFOL 10 MG/ML IV BOLUS
INTRAVENOUS | Status: DC | PRN
  Administered 2017-11-05: 150 mg via INTRAVENOUS

## 2017-11-05 MED ORDER — GABAPENTIN 300 MG PO CAPS
300.0000 mg | ORAL_CAPSULE | ORAL | Status: AC
  Administered 2017-11-05: 300 mg via ORAL

## 2017-11-05 MED ORDER — ENSURE PRE-SURGERY PO LIQD: 592.0000 mL | Freq: Once | ORAL | Status: DC

## 2017-11-05 MED ORDER — DEXAMETHASONE SODIUM PHOSPHATE 10 MG/ML IJ SOLN
INTRAMUSCULAR | Status: DC | PRN
  Administered 2017-11-05: 6 mg via INTRAVENOUS

## 2017-11-05 MED ORDER — SCOPOLAMINE 1 MG/3DAYS TD PT72
1.0000 | MEDICATED_PATCH | Freq: Once | TRANSDERMAL | Status: DC | PRN
  Administered 2017-11-05: 1.5 mg via TRANSDERMAL

## 2017-11-05 MED ORDER — CEFAZOLIN SODIUM-DEXTROSE 2-4 GM/100ML-% IV SOLN
INTRAVENOUS | Status: AC
  Filled 2017-11-05: qty 100

## 2017-11-05 MED ORDER — FENTANYL CITRATE (PF) 100 MCG/2ML IJ SOLN: 50.0000 ug | INTRAMUSCULAR | Status: DC | PRN

## 2017-11-05 MED ORDER — FENTANYL CITRATE (PF) 100 MCG/2ML IJ SOLN: 25.0000 ug | INTRAMUSCULAR | Status: DC | PRN

## 2017-11-05 MED ORDER — CELECOXIB 200 MG PO CAPS
200.0000 mg | ORAL_CAPSULE | ORAL | Status: AC
Start: 2017-11-05 — End: 2017-11-05
  Administered 2017-11-05: 200 mg via ORAL

## 2017-11-05 MED ORDER — FENTANYL CITRATE (PF) 100 MCG/2ML IJ SOLN
INTRAMUSCULAR | Status: DC | PRN
  Administered 2017-11-05: 100 ug via INTRAVENOUS

## 2017-11-05 MED ORDER — LACTATED RINGERS IV SOLN
INTRAVENOUS | Status: DC
  Administered 2017-11-05: 10:00:00 via INTRAVENOUS

## 2017-11-05 MED ORDER — GABAPENTIN 300 MG PO CAPS
ORAL_CAPSULE | ORAL | Status: AC
  Filled 2017-11-05: qty 1

## 2017-11-05 MED ORDER — MIDAZOLAM HCL 2 MG/2ML IJ SOLN
INTRAMUSCULAR | Status: AC
  Filled 2017-11-05: qty 2

## 2017-11-05 MED ORDER — ONDANSETRON HCL 4 MG/2ML IJ SOLN
INTRAMUSCULAR | Status: AC
  Filled 2017-11-05: qty 2

## 2017-11-05 MED ORDER — CEFAZOLIN SODIUM-DEXTROSE 2-4 GM/100ML-% IV SOLN
2.0000 g | INTRAVENOUS | Status: AC
  Administered 2017-11-05: 2 g via INTRAVENOUS

## 2017-11-05 MED ORDER — CELECOXIB 200 MG PO CAPS
ORAL_CAPSULE | ORAL | Status: AC
  Filled 2017-11-05: qty 1

## 2017-11-05 MED ORDER — BUPIVACAINE HCL (PF) 0.25 % IJ SOLN
INTRAMUSCULAR | Status: DC | PRN
  Administered 2017-11-05: 10 mL

## 2017-11-05 MED ORDER — DEXAMETHASONE SODIUM PHOSPHATE 10 MG/ML IJ SOLN
INTRAMUSCULAR | Status: AC
  Filled 2017-11-05: qty 1

## 2017-11-05 MED ORDER — ACETAMINOPHEN 500 MG PO TABS
ORAL_TABLET | ORAL | Status: AC
  Filled 2017-11-05: qty 2

## 2017-11-05 MED ORDER — SCOPOLAMINE 1 MG/3DAYS TD PT72
MEDICATED_PATCH | TRANSDERMAL | Status: AC
  Filled 2017-11-05: qty 1

## 2017-11-05 SURGICAL SUPPLY — 62 items
ADH SKN CLS APL DERMABOND .7 (GAUZE/BANDAGES/DRESSINGS) ×1
APPLIER CLIP 9.375 MED OPEN (MISCELLANEOUS)
APR CLP MED 9.3 20 MLT OPN (MISCELLANEOUS)
BINDER BREAST LRG (GAUZE/BANDAGES/DRESSINGS) ×2 IMPLANT
BINDER BREAST MEDIUM (GAUZE/BANDAGES/DRESSINGS) IMPLANT
BINDER BREAST XLRG (GAUZE/BANDAGES/DRESSINGS) IMPLANT
BINDER BREAST XXLRG (GAUZE/BANDAGES/DRESSINGS) IMPLANT
BLADE SURG 15 STRL LF DISP TIS (BLADE) ×1 IMPLANT
BLADE SURG 15 STRL SS (BLADE) ×3
CANISTER SUC SOCK COL 7IN (MISCELLANEOUS) IMPLANT
CANISTER SUCT 1200ML W/VALVE (MISCELLANEOUS) IMPLANT
CHLORAPREP W/TINT 26ML (MISCELLANEOUS) ×5 IMPLANT
CLIP APPLIE 9.375 MED OPEN (MISCELLANEOUS) IMPLANT
CLIP VESOCCLUDE SM WIDE 6/CT (CLIP) ×2 IMPLANT
CLOSURE WOUND 1/2 X4 (GAUZE/BANDAGES/DRESSINGS) ×1
COVER BACK TABLE 60X90IN (DRAPES) ×3 IMPLANT
COVER MAYO STAND STRL (DRAPES) ×3 IMPLANT
COVER PROBE W GEL 5X96 (DRAPES) ×3 IMPLANT
DECANTER SPIKE VIAL GLASS SM (MISCELLANEOUS) IMPLANT
DERMABOND ADVANCED (GAUZE/BANDAGES/DRESSINGS) ×2
DERMABOND ADVANCED .7 DNX12 (GAUZE/BANDAGES/DRESSINGS) ×1 IMPLANT
DEVICE DUBIN W/COMP PLATE 8390 (MISCELLANEOUS) ×3 IMPLANT
DRAPE LAPAROSCOPIC ABDOMINAL (DRAPES) ×3 IMPLANT
DRAPE UTILITY XL STRL (DRAPES) ×3 IMPLANT
DRSG TEGADERM 4X4.75 (GAUZE/BANDAGES/DRESSINGS) IMPLANT
ELECT COATED BLADE 2.86 ST (ELECTRODE) ×3 IMPLANT
ELECT REM PT RETURN 9FT ADLT (ELECTROSURGICAL) ×3
ELECTRODE REM PT RTRN 9FT ADLT (ELECTROSURGICAL) ×1 IMPLANT
GAUZE SPONGE 4X4 12PLY STRL LF (GAUZE/BANDAGES/DRESSINGS) IMPLANT
GLOVE BIO SURGEON STRL SZ7 (GLOVE) ×6 IMPLANT
GLOVE BIOGEL PI IND STRL 7.0 (GLOVE) IMPLANT
GLOVE BIOGEL PI IND STRL 7.5 (GLOVE) ×1 IMPLANT
GLOVE BIOGEL PI INDICATOR 7.0 (GLOVE) ×4
GLOVE BIOGEL PI INDICATOR 7.5 (GLOVE) ×2
GLOVE ECLIPSE 6.5 STRL STRAW (GLOVE) ×2 IMPLANT
GOWN STRL REUS W/ TWL LRG LVL3 (GOWN DISPOSABLE) ×2 IMPLANT
GOWN STRL REUS W/TWL LRG LVL3 (GOWN DISPOSABLE) ×6
HEMOSTAT ARISTA ABSORB 3G PWDR (MISCELLANEOUS) ×2 IMPLANT
ILLUMINATOR WAVEGUIDE N/F (MISCELLANEOUS) ×2 IMPLANT
KIT MARKER MARGIN INK (KITS) ×3 IMPLANT
LIGHT WAVEGUIDE WIDE FLAT (MISCELLANEOUS) IMPLANT
NDL HYPO 25X1 1.5 SAFETY (NEEDLE) ×1 IMPLANT
NEEDLE HYPO 25X1 1.5 SAFETY (NEEDLE) ×3 IMPLANT
NS IRRIG 1000ML POUR BTL (IV SOLUTION) IMPLANT
PACK BASIN DAY SURGERY FS (CUSTOM PROCEDURE TRAY) ×3 IMPLANT
PENCIL BUTTON HOLSTER BLD 10FT (ELECTRODE) ×3 IMPLANT
SLEEVE SCD COMPRESS KNEE MED (MISCELLANEOUS) ×3 IMPLANT
SPONGE LAP 4X18 X RAY DECT (DISPOSABLE) ×3 IMPLANT
STRIP CLOSURE SKIN 1/2X4 (GAUZE/BANDAGES/DRESSINGS) ×2 IMPLANT
SUT MNCRL AB 4-0 PS2 18 (SUTURE) IMPLANT
SUT MON AB 5-0 PS2 18 (SUTURE) ×2 IMPLANT
SUT SILK 2 0 SH (SUTURE) IMPLANT
SUT VIC AB 2-0 SH 27 (SUTURE) ×3
SUT VIC AB 2-0 SH 27XBRD (SUTURE) ×1 IMPLANT
SUT VIC AB 3-0 SH 27 (SUTURE) ×3
SUT VIC AB 3-0 SH 27X BRD (SUTURE) ×1 IMPLANT
SYR CONTROL 10ML LL (SYRINGE) ×3 IMPLANT
TOWEL OR 17X24 6PK STRL BLUE (TOWEL DISPOSABLE) ×3 IMPLANT
TOWEL OR NON WOVEN STRL DISP B (DISPOSABLE) ×3 IMPLANT
TUBE CONNECTING 20'X1/4 (TUBING)
TUBE CONNECTING 20X1/4 (TUBING) IMPLANT
YANKAUER SUCT BULB TIP NO VENT (SUCTIONS) IMPLANT

## 2017-11-05 NOTE — H&P (Signed)
46 yof referred by Dr Thomasena Edisollins for left breast mass. she has no personal history of breast disease. no family history breast cancer. she underwent screening mm that shows c density breasts. there is a an uoq left breast distortion. on us there is a 5x7x847mm mass/distortion with no abnl nodes present. she underwent core biopsy that is a csl. she is here with her husband to discuss options   Past Surgical History Breast Biopsy  Left. Cesarean Section - 1   Diagnostic Studies History  Colonoscopy  never Mammogram  within last year Pap Smear  1-5 years ago  Allergies No Known Drug Allergies  Allergies Reconciled   Medication History  No Current Medications Medications Reconciled  Social History  Alcohol use  Occasional alcohol use. Caffeine use  Coffee, Tea. No drug use  Tobacco use  Never smoker.  Family History  Migraine Headache  Mother. Prostate Cancer  Father.  Pregnancy / Birth History  Age at menarche  10 years. Contraceptive History  Oral contraceptives. Gravida  1 Length (months) of breastfeeding  >24 Maternal age  79>40 Para  1 Regular periods   Review of Systems  General Not Present- Appetite Loss, Chills, Fatigue, Fever, Night Sweats, Weight Gain and Weight Loss. Skin Not Present- Change in Wart/Mole, Dryness, Hives, Jaundice, New Lesions, Non-Healing Wounds, Rash and Ulcer. HEENT Present- Wears glasses/contact lenses. Not Present- Earache, Hearing Loss, Hoarseness, Nose Bleed, Oral Ulcers, Ringing in the Ears, Seasonal Allergies, Sinus Pain, Sore Throat, Visual Disturbances and Yellow Eyes. Respiratory Not Present- Bloody sputum, Chronic Cough, Difficulty Breathing, Snoring and Wheezing. Breast Present- Breast Mass. Not Present- Breast Pain, Nipple Discharge and Skin Changes. Cardiovascular Not Present- Chest Pain, Difficulty Breathing Lying Down, Leg Cramps, Palpitations, Rapid Heart Rate, Shortness of Breath and Swelling of  Extremities. Gastrointestinal Not Present- Abdominal Pain, Bloating, Bloody Stool, Change in Bowel Habits, Chronic diarrhea, Constipation, Difficulty Swallowing, Excessive gas, Gets full quickly at meals, Hemorrhoids, Indigestion, Nausea, Rectal Pain and Vomiting. Female Genitourinary Not Present- Frequency, Nocturia, Painful Urination, Pelvic Pain and Urgency. Musculoskeletal Not Present- Back Pain, Joint Pain, Joint Stiffness, Muscle Pain, Muscle Weakness and Swelling of Extremities. Neurological Not Present- Decreased Memory, Fainting, Headaches, Numbness, Seizures, Tingling, Tremor, Trouble walking and Weakness. Psychiatric Not Present- Anxiety, Bipolar, Change in Sleep Pattern, Depression, Fearful and Frequent crying. Endocrine Not Present- Cold Intolerance, Excessive Hunger, Hair Changes, Heat Intolerance, Hot flashes and New Diabetes. Hematology Not Present- Blood Thinners, Easy Bruising, Excessive bleeding, Gland problems, HIV and Persistent Infections.  Vitals Weight: 146.4 lb Height: 62in Body Surface Area: 1.67 m Body Mass Index: 26.78 kg/m  Temp.: 98.36F  Pulse: 103 (Regular)  BP: 112/74 (Sitting, Left Arm, Standard) Physical Exam General Mental Status-Alert. Orientation-Oriented X3. Head and Neck Trachea-midline. Thyroid Gland Characteristics - normal size and consistency. Chest and Lung Exam Chest and lung exam reveals -quiet, even and easy respiratory effort with no use of accessory muscles and on auscultation, normal breath sounds, no adventitious sounds and normal vocal resonance. Breast Nipples-No Discharge. Breast Lump-No Palpable Breast Mass. Cardiovascular Cardiovascular examination reveals -normal heart sounds, regular rate and rhythm with no murmurs. Lymphatic Head & Neck General Head & Neck Lymphatics: Bilateral - Description - Normal. Axillary General Axillary Region: Bilateral - Description - Normal. 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 I think surgery in young healthy woman reasonable as  there is 10% upgrade risk. we discussed seed guided excision with risks/recovery.

## 2017-11-05 NOTE — Transfer of Care (Signed)
Immediate Anesthesia Transfer of Care Note  Patient: Traci Kemp  Procedure(s) Performed: LEFT BREAST SEED GUIDED EXCISIONAL BIOPSY (Left Breast)  Patient Location: PACU  Anesthesia Type:General  Level of Consciousness: awake, alert  and oriented  Airway & Oxygen Therapy: Patient Spontanous Breathing and Patient connected to face mask oxygen  Post-op Assessment: Report given to RN and Post -op Vital signs reviewed and stable  Post vital signs: Reviewed and stable  Last Vitals:  Vitals:   11/05/17 0924  BP: 108/67  Pulse: 68  Resp: 18  Temp: 36.9 C  SpO2: 100%    Last Pain:  Vitals:   11/05/17 0924  TempSrc: Oral         Complications: No apparent anesthesia complications

## 2017-11-05 NOTE — Anesthesia Procedure Notes (Signed)
Procedure Name: LMA Insertion Date/Time: 11/05/2017 10:51 AM Performed by: Shanon PayorGregory, Adreonna M, CRNA Pre-anesthesia Checklist: Patient identified, Emergency Drugs available, Suction available, Patient being monitored and Timeout performed Patient Re-evaluated:Patient Re-evaluated prior to induction Oxygen Delivery Method: Circle system utilized Preoxygenation: Pre-oxygenation with 100% oxygen Induction Type: IV induction LMA: LMA inserted LMA Size: 4.0 Number of attempts: 1 Placement Confirmation: positive ETCO2 and breath sounds checked- equal and bilateral Tube secured with: Tape Dental Injury: Teeth and Oropharynx as per pre-operative assessment

## 2017-11-05 NOTE — Op Note (Signed)
Preoperative diagnosesleft breast mass Postoperative diagnosis: Same as above Procedure: Leftbreast seed guided excisional biopsy Surgeon: Dr. Matt Bodi Palmeri Anesthesia: Gen. Estimated blood loss: minimal Complications: None Drains: None Specimens: Leftbreast tissue marked with paint Sponge and needle count correct at completion Disposition to recovery stable  Indications: This is a 46 yof with left breast mass on screening mammogram.  The core biopsy is a csl. She was referred for consideration of excision. We have discussed options and have elected to proceed with seed guided excision and duct excision  Procedure: After informed consent was obtained she was then taken to the operating room. She was given antibiotics.Sequential compression devices were on her legs. She was placed under general anesthesia without complication. Her chestwas then prepped and draped in the standard sterile surgical fashion. A surgical timeout was then performed.   The seed was in thelateral leftbreast.I infiltrated marcaine and made a periareolar incision to hide the scar.I used the lighted retractors to tunnel to the lateral lesion using the seed as guidance.  I then used the neoprobe to guide excision of the seedand surrounding tissue.Mammogram confirmed removal of seed and the clip. This was then all sent to pathology. Hemostasis was observed. I closed the breast tissue with a 2-0 Vicryl. The dermis was closed with 3-0 Vicryl and the skin with 5-0 Monocryl.Dermabond and steristrips were placed on the incision. A breast binder was placed. She was transferred to recovery stable    

## 2017-11-05 NOTE — Discharge Instructions (Signed)
°Post Anesthesia Home Care Instructions ° °Activity: °Get plenty of rest for the remainder of the day. A responsible individual must stay with you for 24 hours following the procedure.  °For the next 24 hours, DO NOT: °-Drive a car °-Operate machinery °-Drink alcoholic beverages °-Take any medication unless instructed by your physician °-Make any legal decisions or sign important papers. ° °Meals: °Start with liquid foods such as gelatin or soup. Progress to regular foods as tolerated. Avoid greasy, spicy, heavy foods. If nausea and/or vomiting occur, drink only clear liquids until the nausea and/or vomiting subsides. Call your physician if vomiting continues. ° °Special Instructions/Symptoms: °Your throat may feel dry or sore from the anesthesia or the breathing tube placed in your throat during surgery. If this causes discomfort, gargle with warm salt water. The discomfort should disappear within 24 hours. ° °If you had a scopolamine patch placed behind your ear for the management of post- operative nausea and/or vomiting: ° °1. The medication in the patch is effective for 72 hours, after which it should be removed.  Wrap patch in a tissue and discard in the trash. Wash hands thoroughly with soap and water. °2. You may remove the patch earlier than 72 hours if you experience unpleasant side effects which may include dry mouth, dizziness or visual disturbances. °3. Avoid touching the patch. Wash your hands with soap and water after contact with the patch. °  °[facility] °Office Phone Number [phone] ° POST OP INSTRUCTIONS ° °Always review your discharge instruction sheet given to you by the facility where your surgery was performed. ° °IF YOU HAVE DISABILITY OR FAMILY LEAVE FORMS, YOU MUST BRING THEM TO THE OFFICE FOR PROCESSING.  DO NOT GIVE THEM TO YOUR DOCTOR. ° °1. A prescription for pain medication may be given to you upon discharge.  Take your pain medication as prescribed, if needed.   If narcotic pain medicine is not needed, then you may take acetaminophen (Tylenol), naprosyn (Alleve) or ibuprofen (Advil) as needed. °2. Take your usually prescribed medications unless otherwise directed °3. If you need a refill on your pain medication, please contact your pharmacy.  They will contact our office to request authorization.  Prescriptions will not be filled after 5pm or on week-ends. °4. You should eat very light the first 24 hours after surgery, such as soup, crackers, pudding, etc.  Resume your normal diet the day after surgery. °5. Most patients will experience some swelling and bruising in the breast.  Ice packs and a good support bra will help.  Wear the breast binder provided or a sports bra for 72 hours day and night.  After that wear a sports bra during the day until you return to the office. Swelling and bruising can take several days to resolve.  °6. It is common to experience some constipation if taking pain medication after surgery.  Increasing fluid intake and taking a stool softener will usually help or prevent this problem from occurring.  A mild laxative (Milk of Magnesia or Miralax) should be taken according to package directions if there are no bowel movements after 48 hours. °7. Unless discharge instructions indicate otherwise, you may remove your bandages 48 hours after surgery and you may shower at that time.  You may have steri-strips (small skin tapes) in place directly over the incision.  These strips should be left on the skin for 7-10 days and will come off on their own.  If your surgeon used skin glue on the incision,   you may shower in 24 hours.  The glue will flake off over the next 2-3 weeks.  Any sutures or staples will be removed at the office during your follow-up visit. °8. ACTIVITIES:  You may resume regular daily activities (gradually increasing) beginning the next day.  Wearing a good support bra or sports bra minimizes pain and swelling.  You may have sexual  intercourse when it is comfortable. °a. You may drive when you no longer are taking prescription pain medication, you can comfortably wear a seatbelt, and you can safely maneuver your car and apply brakes. °b. RETURN TO WORK:  ______________________________________________________________________________________ °9. You should see your doctor in the office for a follow-up appointment approximately two weeks after your surgery.  Your doctor’s nurse will typically make your follow-up appointment when she calls you with your pathology report.  Expect your pathology report 3-4 business days after your surgery.  You may call to check if you do not hear from us after three days. °10. OTHER INSTRUCTIONS: _______________________________________________________________________________________________ _____________________________________________________________________________________________________________________________________ °_____________________________________________________________________________________________________________________________________ °_____________________________________________________________________________________________________________________________________ ° °WHEN TO CALL DR WAKEFIELD: °1. Fever over 101.0 °2. Nausea and/or vomiting. °3. Extreme swelling or bruising. °4. Continued bleeding from incision. °5. Increased pain, redness, or drainage from the incision. ° °The clinic staff is available to answer your questions during regular business hours.  Please don’t hesitate to call and ask to speak to one of the nurses for clinical concerns.  If you have a medical emergency, go to the nearest emergency room or call 911.  A surgeon from [facility] is always on call at the hospital. ° °For further questions, please visit [email or web address] ° °Post Anesthesia Home Care Instructions ° °Activity: °Get plenty of rest for the remainder of the day. A responsible  individual must stay with you for 24 hours following the procedure.  °For the next 24 hours, DO NOT: °-Drive a car °-Operate machinery °-Drink alcoholic beverages °-Take any medication unless instructed by your physician °-Make any legal decisions or sign important papers. ° °Meals: °Start with liquid foods such as gelatin or soup. Progress to regular foods as tolerated. Avoid greasy, spicy, heavy foods. If nausea and/or vomiting occur, drink only clear liquids until the nausea and/or vomiting subsides. Call your physician if vomiting continues. ° °Special Instructions/Symptoms: °Your throat may feel dry or sore from the anesthesia or the breathing tube placed in your throat during surgery. If this causes discomfort, gargle with warm salt water. The discomfort should disappear within 24 hours. ° °If you had a scopolamine patch placed behind your ear for the management of post- operative nausea and/or vomiting: ° °1. The medication in the patch is effective for 72 hours, after which it should be removed.  Wrap patch in a tissue and discard in the trash. Wash hands thoroughly with soap and water. °2. You may remove the patch earlier than 72 hours if you experience unpleasant side effects which may include dry mouth, dizziness or visual disturbances. °3. Avoid touching the patch. Wash your hands with soap and water after contact with the patch. °  ° ° °

## 2017-11-05 NOTE — Anesthesia Postprocedure Evaluation (Signed)
Anesthesia Post Note  Patient: Traci Kemp  Procedure(s) Performed: LEFT BREAST SEED GUIDED EXCISIONAL BIOPSY (Left Breast)     Patient location during evaluation: PACU Anesthesia Type: General Level of consciousness: awake and alert Pain management: pain level controlled Vital Signs Assessment: post-procedure vital signs reviewed and stable Respiratory status: spontaneous breathing, nonlabored ventilation and respiratory function stable Cardiovascular status: blood pressure returned to baseline and stable Postop Assessment: no apparent nausea or vomiting Anesthetic complications: no    Last Vitals:  Vitals:   11/05/17 1145 11/05/17 1203  BP: 111/79 111/79  Pulse: 64 61  Resp: 20   Temp:  36.6 C  SpO2: 100% 98%    Last Pain:  Vitals:   11/05/17 1230  TempSrc:   PainSc: 0-No pain                 Cecile HearingStephen Edward Berkley Cronkright

## 2017-11-05 NOTE — Interval H&P Note (Signed)
History and Physical Interval Note:  11/05/2017 10:35 AM  Traci Kemp  has presented today for surgery, with the diagnosis of LEFT BREAST MASS  The various methods of treatment have been discussed with the patient and family. After consideration of risks, benefits and other options for treatment, the patient has consented to  Procedure(s): LEFT BREAST SEED GUIDED EXCISIONAL BIOPSY (Left) as a surgical intervention .  The patient's history has been reviewed, patient examined, no change in status, stable for surgery.  I have reviewed the patient's chart and labs.  Questions were answered to the patient's satisfaction.     Emelia LoronMatthew Brave Dack

## 2017-11-05 NOTE — Anesthesia Preprocedure Evaluation (Signed)
Anesthesia Evaluation  Patient identified by MRN, date of birth, ID band Patient awake    Reviewed: Allergy & Precautions, NPO status , Patient's Chart, lab work & pertinent test results  Airway Mallampati: I  TM Distance: >3 FB Neck ROM: Full    Dental  (+) Teeth Intact, Dental Advisory Given   Pulmonary neg pulmonary ROS,    Pulmonary exam normal breath sounds clear to auscultation       Cardiovascular Exercise Tolerance: Good negative cardio ROS Normal cardiovascular exam Rhythm:Regular Rate:Normal     Neuro/Psych negative neurological ROS  negative psych ROS   GI/Hepatic negative GI ROS, Neg liver ROS,   Endo/Other  negative endocrine ROS  Renal/GU negative Renal ROS     Musculoskeletal negative musculoskeletal ROS (+)   Abdominal   Peds  Hematology negative hematology ROS (+)   Anesthesia Other Findings Day of surgery medications reviewed with the patient.  Left breast mass  Reproductive/Obstetrics                             Anesthesia Physical Anesthesia Plan  ASA: I  Anesthesia Plan: General   Post-op Pain Management:    Induction: Intravenous  PONV Risk Score and Plan: 3 and Dexamethasone, Ondansetron, Midazolam and Scopolamine patch - Pre-op  Airway Management Planned: LMA  Additional Equipment:   Intra-op Plan:   Post-operative Plan: Extubation in OR  Informed Consent: I have reviewed the patients History and Physical, chart, labs and discussed the procedure including the risks, benefits and alternatives for the proposed anesthesia with the patient or authorized representative who has indicated his/her understanding and acceptance.   Dental advisory given  Plan Discussed with: CRNA  Anesthesia Plan Comments: (Risks/benefits of general anesthesia discussed with patient including risk of damage to teeth, lips, gum, and tongue, nausea/vomiting, allergic  reactions to medications, and the possibility of heart attack, stroke and death.  All patient questions answered.  Patient wishes to proceed.)        Anesthesia Quick Evaluation

## 2017-11-06 ENCOUNTER — Encounter (HOSPITAL_BASED_OUTPATIENT_CLINIC_OR_DEPARTMENT_OTHER): Payer: Self-pay | Admitting: General Surgery

## 2018-10-05 ENCOUNTER — Other Ambulatory Visit: Payer: Self-pay | Admitting: Obstetrics and Gynecology

## 2018-10-05 DIAGNOSIS — Z853 Personal history of malignant neoplasm of breast: Secondary | ICD-10-CM

## 2018-10-12 ENCOUNTER — Other Ambulatory Visit: Payer: Self-pay | Admitting: Obstetrics and Gynecology

## 2018-10-12 ENCOUNTER — Ambulatory Visit
Admission: RE | Admit: 2018-10-12 | Discharge: 2018-10-12 | Disposition: A | Discharge status: Home / Self Care | Payer: Managed Care, Other (non HMO) | Source: Ambulatory Visit | Attending: Obstetrics and Gynecology | Admitting: Obstetrics and Gynecology

## 2018-10-12 DIAGNOSIS — Z853 Personal history of malignant neoplasm of breast: Secondary | ICD-10-CM

## 2018-10-12 HISTORY — DX: Personal history of irradiation: Z92.3

## 2019-10-17 ENCOUNTER — Ambulatory Visit
Admission: RE | Admit: 2019-10-17 | Discharge: 2019-10-17 | Disposition: A | Discharge status: Home / Self Care | Payer: Managed Care, Other (non HMO) | Source: Ambulatory Visit | Attending: Obstetrics and Gynecology | Admitting: Obstetrics and Gynecology

## 2019-10-17 ENCOUNTER — Other Ambulatory Visit: Payer: Self-pay

## 2019-10-17 DIAGNOSIS — Z853 Personal history of malignant neoplasm of breast: Secondary | ICD-10-CM

## 2020-07-23 ENCOUNTER — Other Ambulatory Visit: Payer: Self-pay | Admitting: Obstetrics and Gynecology

## 2020-07-23 DIAGNOSIS — Z9889 Other specified postprocedural states: Secondary | ICD-10-CM

## 2020-07-23 DIAGNOSIS — Z853 Personal history of malignant neoplasm of breast: Secondary | ICD-10-CM

## 2020-09-14 ENCOUNTER — Other Ambulatory Visit: Payer: Self-pay | Admitting: Obstetrics and Gynecology

## 2020-09-14 DIAGNOSIS — N6310 Unspecified lump in the right breast, unspecified quadrant: Secondary | ICD-10-CM

## 2020-09-28 ENCOUNTER — Ambulatory Visit
Admission: RE | Admit: 2020-09-28 | Discharge: 2020-09-28 | Disposition: A | Discharge status: Home / Self Care | Payer: Managed Care, Other (non HMO) | Source: Ambulatory Visit | Attending: Obstetrics and Gynecology | Admitting: Obstetrics and Gynecology

## 2020-09-28 ENCOUNTER — Other Ambulatory Visit: Payer: Self-pay

## 2020-09-28 DIAGNOSIS — N6310 Unspecified lump in the right breast, unspecified quadrant: Secondary | ICD-10-CM

## 2021-07-23 IMAGING — US A
1 series · 3 of 3 positions shown · non-contrast
Comparison: Previous exam(s).

CLINICAL DATA: History of a left lumpectomy for breast carcinoma,
performed in October 2017 treated with adjuvant radiation therapy.
Patient's referring clinician reports a lump in the right breast.

EXAM:
DIGITAL DIAGNOSTIC BILATERAL MAMMOGRAM WITH CAD AND TOMO
ULTRASOUND RIGHT BREAST

[Series 1: a · 0.07mm/px · 3 of 3 slices shown]
[im 1/3]
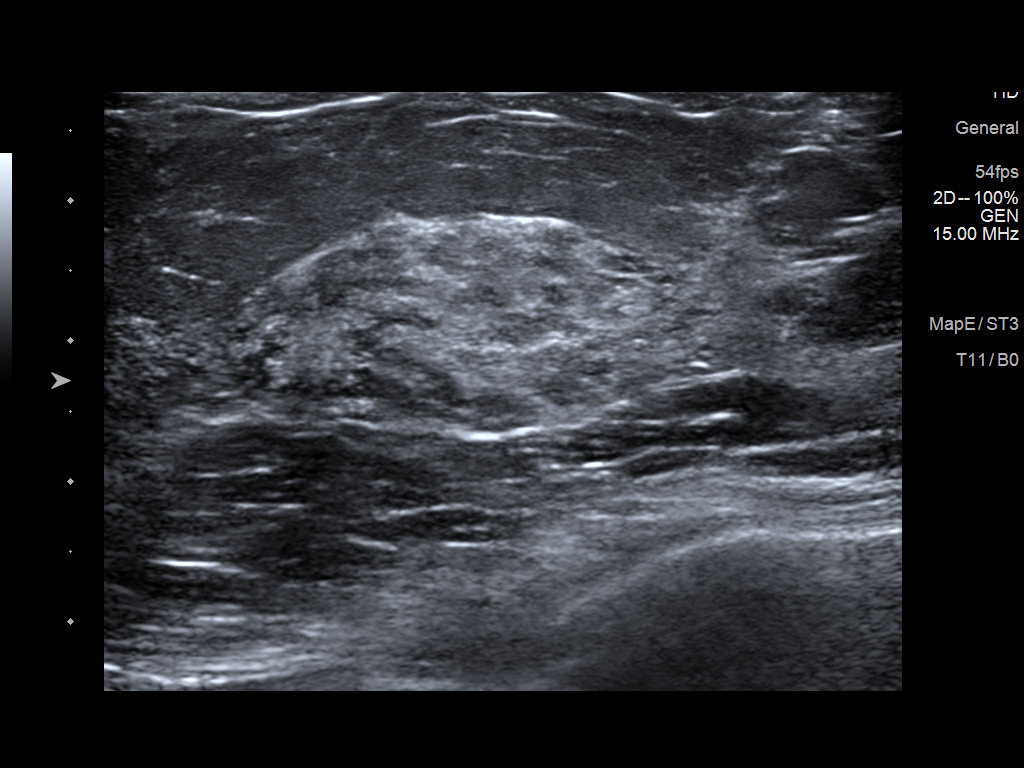
[im 2/3]
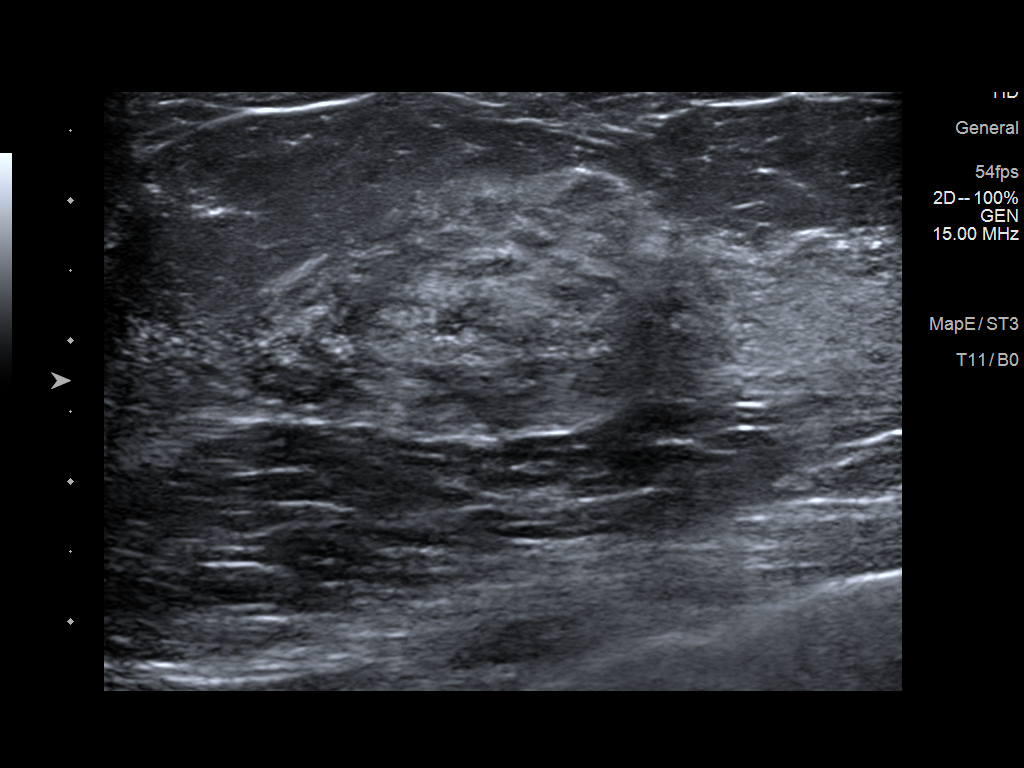
[im 3/3]
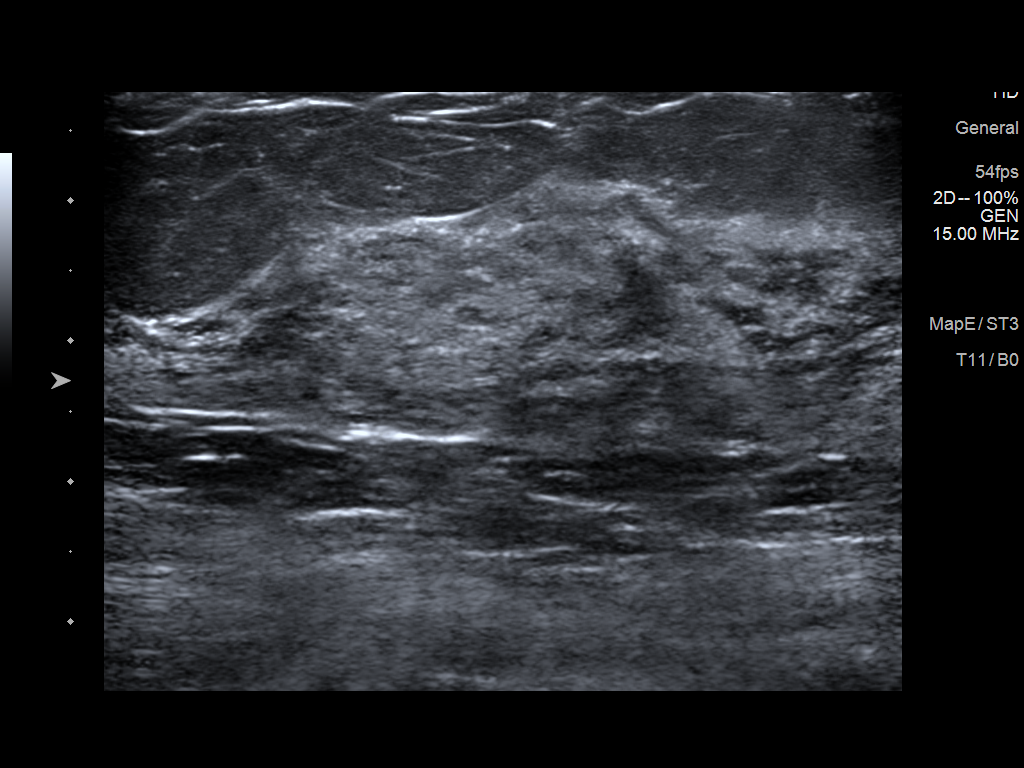

[3 of 3 positions shown; findings below may reference images not displayed]

ACR Breast Density Category c: The breast tissue is heterogeneously
dense, which may obscure small masses.
FINDINGS: There is a focal area of increased attenuation in the upper right
breast, at 11-12 o'clock, which is unchanged from prior exams. This
is consistent with focal asymmetric fibroglandular tissue. There is
no defined mass.

In the left breast upper outer quadrant there is architectural
distortion from post lumpectomy scarring, stable from previous
year's exam.

There are no breast masses or areas of nonsurgical architectural
distortion. There are no suspicious calcifications.

Mammographic images were processed with CAD.

On physical exam, there is a focal area mobile prominent
fibroglandular tissue in the upper right breast. No defined mass.

Targeted ultrasound is performed, showing a focal area
fibroglandular tissue in the right breast at 12 o'clock, 3 cm from
the nipple, corresponding to the mammographic opacity. No mass or
suspicious lesion.
IMPRESSION: 1. No evidence of new or recurrent breast carcinoma.
2. The reported palpable abnormality corresponds to a stable area of
focal fibroglandular tissue in the upper right breast.
3. Stable post lumpectomy changes on the left.

RECOMMENDATION:
Diagnostic mammography in 1 year per standard post lumpectomy
protocol.

I have discussed the findings and recommendations with the patient.
If applicable, a reminder letter will be sent to the patient
regarding the next appointment.

BI-RADS CATEGORY  2: Benign.

## 2021-07-23 IMAGING — MG DIGITAL DIAGNOSTIC BILAT W/ TOMO W/ CAD
6 of 11 series · 6 of 31 positions shown · non-contrast
Comparison: Previous exam(s).

CLINICAL DATA: History of a left lumpectomy for breast carcinoma,
performed in October 2017 treated with adjuvant radiation therapy.
Patient's referring clinician reports a lump in the right breast.

EXAM:
DIGITAL DIAGNOSTIC BILATERAL MAMMOGRAM WITH CAD AND TOMO
ULTRASOUND RIGHT BREAST

[L CC]
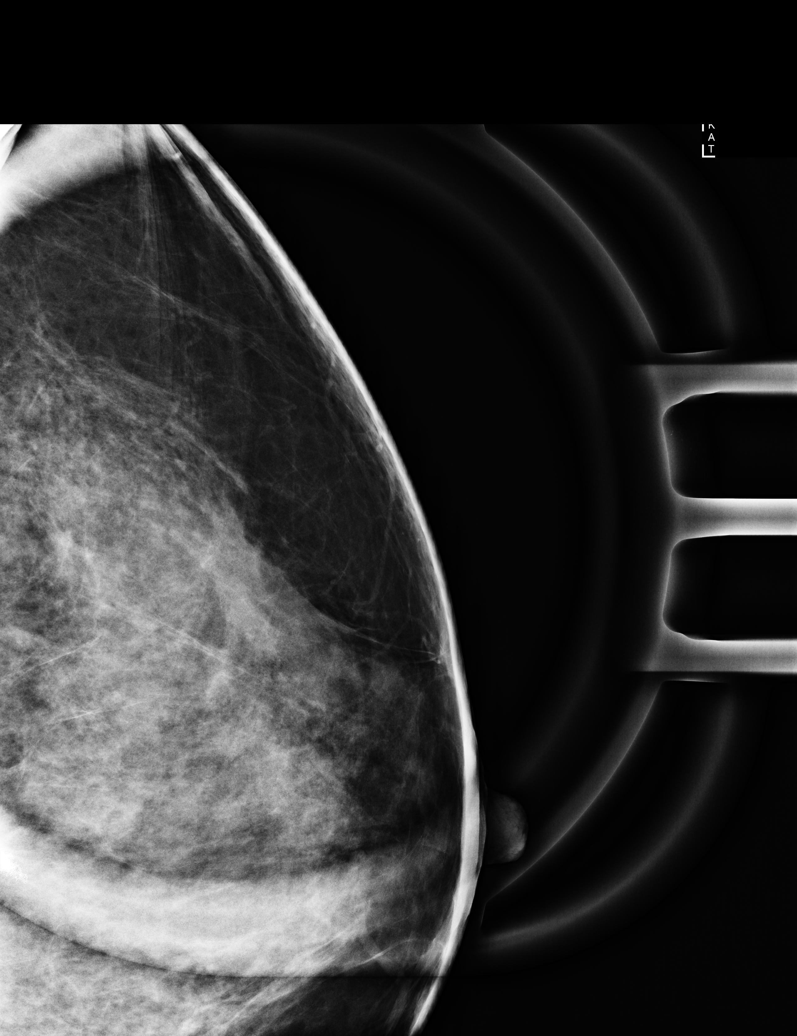

[R TAN synth-2D]
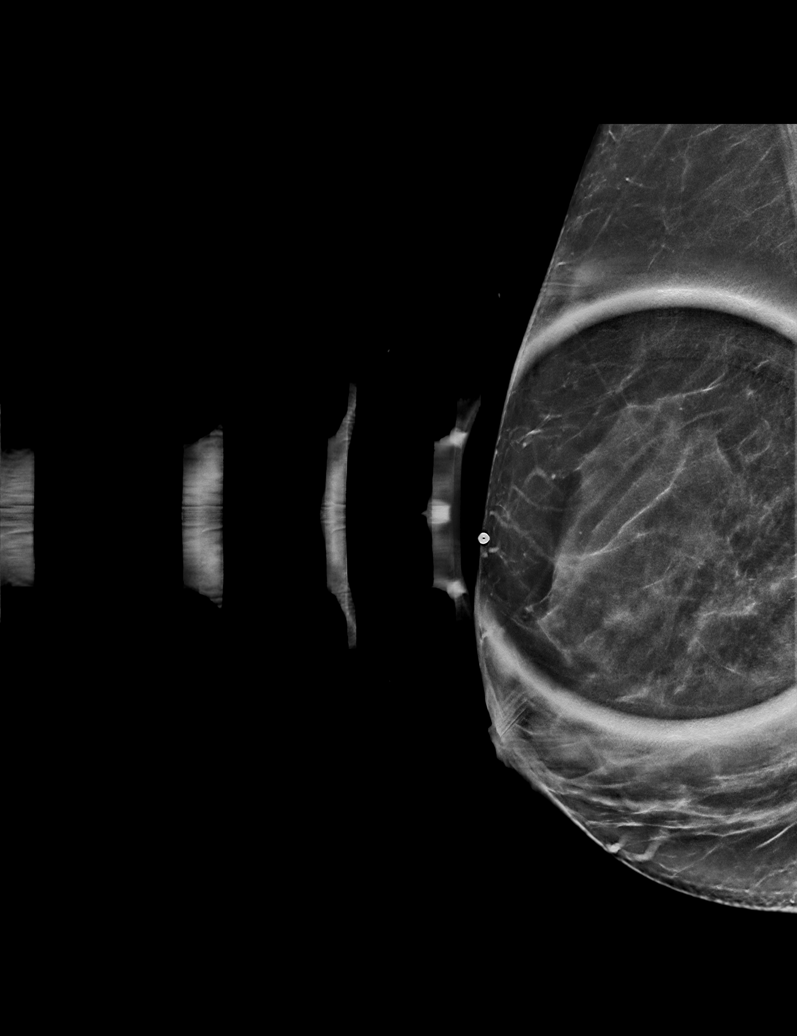

[R MLO synth-2D]
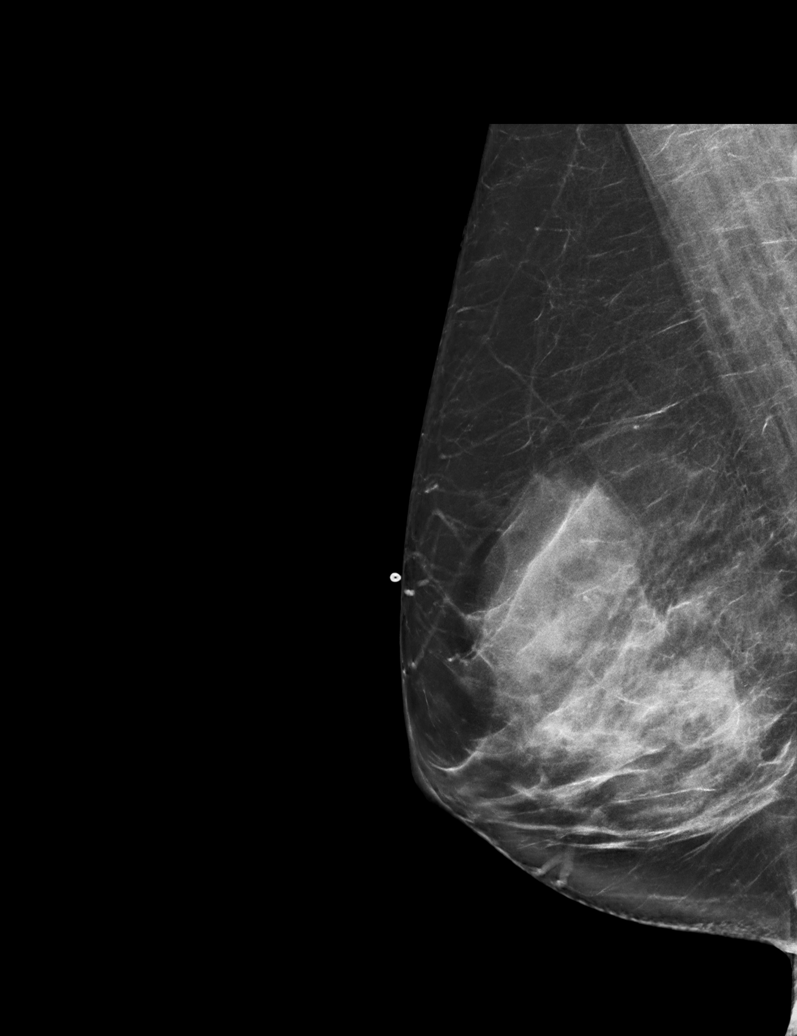

[L CC synth-2D]
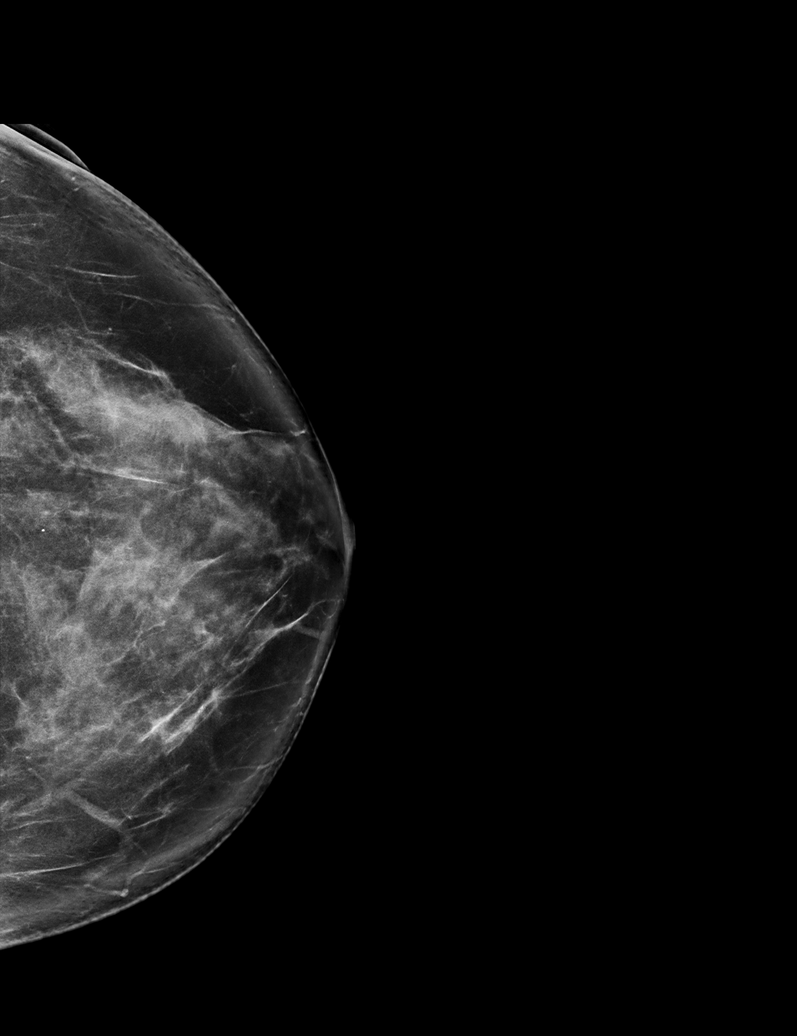

[L MLO synth-2D]
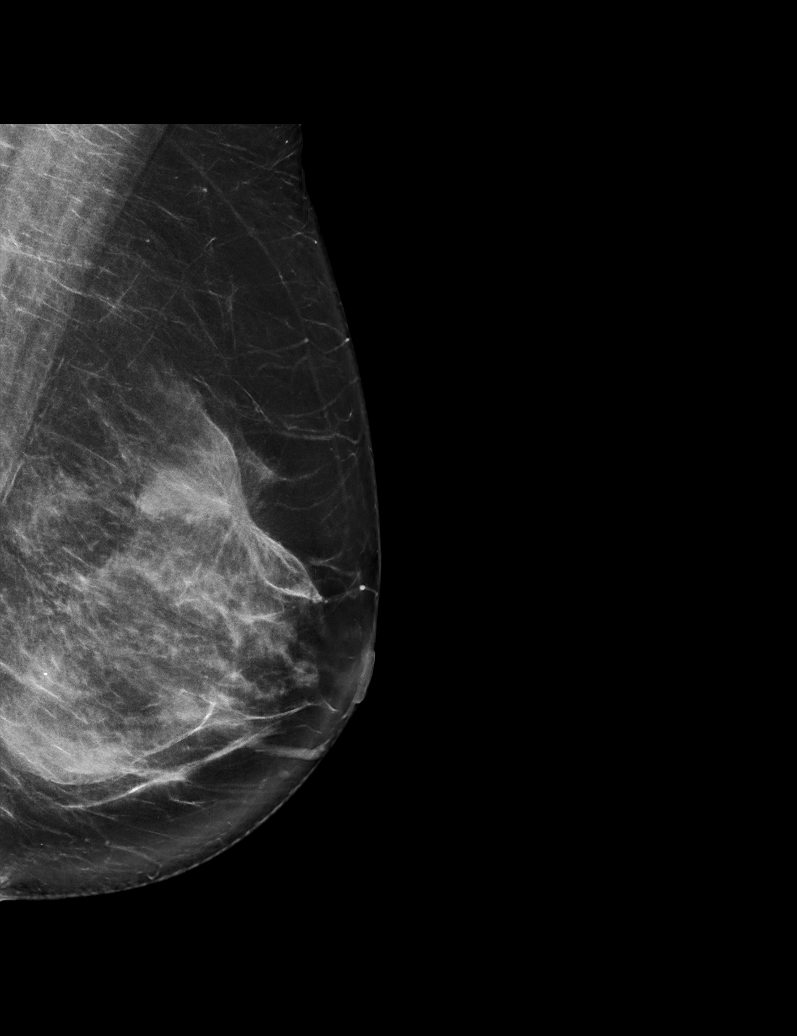

[R CC synth-2D]
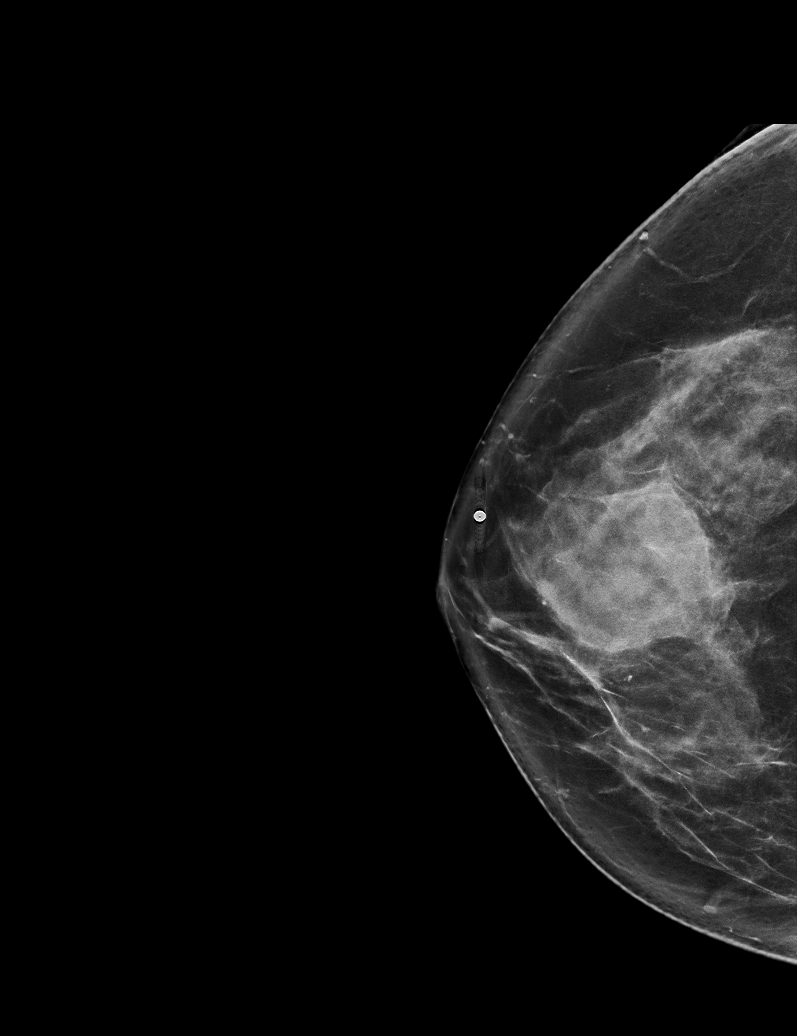

[6 of 31 positions shown; findings below may reference images not displayed]

ACR Breast Density Category c: The breast tissue is heterogeneously
dense, which may obscure small masses.
FINDINGS: There is a focal area of increased attenuation in the upper right
breast, at 11-12 o'clock, which is unchanged from prior exams. This
is consistent with focal asymmetric fibroglandular tissue. There is
no defined mass.

In the left breast upper outer quadrant there is architectural
distortion from post lumpectomy scarring, stable from previous
year's exam.

There are no breast masses or areas of nonsurgical architectural
distortion. There are no suspicious calcifications.

Mammographic images were processed with CAD.

On physical exam, there is a focal area mobile prominent
fibroglandular tissue in the upper right breast. No defined mass.

Targeted ultrasound is performed, showing a focal area
fibroglandular tissue in the right breast at 12 o'clock, 3 cm from
the nipple, corresponding to the mammographic opacity. No mass or
suspicious lesion.
IMPRESSION: 1. No evidence of new or recurrent breast carcinoma.
2. The reported palpable abnormality corresponds to a stable area of
focal fibroglandular tissue in the upper right breast.
3. Stable post lumpectomy changes on the left.

RECOMMENDATION:
Diagnostic mammography in 1 year per standard post lumpectomy
protocol.

I have discussed the findings and recommendations with the patient.
If applicable, a reminder letter will be sent to the patient
regarding the next appointment.

BI-RADS CATEGORY  2: Benign.

## 2022-03-20 ENCOUNTER — Other Ambulatory Visit: Payer: Self-pay | Admitting: Obstetrics and Gynecology

## 2022-03-20 DIAGNOSIS — Z853 Personal history of malignant neoplasm of breast: Secondary | ICD-10-CM

## 2022-04-04 ENCOUNTER — Ambulatory Visit
Admission: RE | Admit: 2022-04-04 | Discharge: 2022-04-04 | Disposition: A | Discharge status: Home / Self Care | Payer: Managed Care, Other (non HMO) | Source: Ambulatory Visit | Attending: Obstetrics and Gynecology | Admitting: Obstetrics and Gynecology

## 2022-04-04 DIAGNOSIS — Z853 Personal history of malignant neoplasm of breast: Secondary | ICD-10-CM

## 2023-07-03 ENCOUNTER — Other Ambulatory Visit: Payer: Self-pay | Admitting: Obstetrics and Gynecology

## 2023-07-03 DIAGNOSIS — Z1231 Encounter for screening mammogram for malignant neoplasm of breast: Secondary | ICD-10-CM

## 2023-07-22 ENCOUNTER — Ambulatory Visit
Admission: RE | Admit: 2023-07-22 | Discharge: 2023-07-22 | Disposition: A | Discharge status: Home / Self Care | Payer: Managed Care, Other (non HMO) | Source: Ambulatory Visit | Attending: Obstetrics and Gynecology | Admitting: Obstetrics and Gynecology

## 2023-07-22 DIAGNOSIS — Z1231 Encounter for screening mammogram for malignant neoplasm of breast: Secondary | ICD-10-CM
